# Patient Record
Sex: Male | Born: 1945 | Race: Black or African American | Marital: Married | Smoking: Former smoker
Health system: Southern US, Community
[De-identification: ages and names within clinical notes are randomized; demographics above are authoritative.]

## PROBLEM LIST (undated history)

## (undated) DIAGNOSIS — I10 Essential (primary) hypertension: Secondary | ICD-10-CM

## (undated) DIAGNOSIS — K219 Gastro-esophageal reflux disease without esophagitis: Secondary | ICD-10-CM

## (undated) DIAGNOSIS — J302 Other seasonal allergic rhinitis: Secondary | ICD-10-CM

## (undated) HISTORY — PX: BACK SURGERY: SHX140

## (undated) HISTORY — PX: PROSTATE SURGERY: SHX751

---

## 2010-05-03 ENCOUNTER — Ambulatory Visit: Payer: Self-pay | Admitting: Internal Medicine

## 2013-01-01 ENCOUNTER — Ambulatory Visit: Payer: Self-pay | Admitting: Physician Assistant

## 2013-01-01 LAB — URINALYSIS, COMPLETE
Bacteria: NEGATIVE
Bilirubin,UR: NEGATIVE
Glucose,UR: NEGATIVE mg/dL (ref 0–75)
Ketone: NEGATIVE
Nitrite: NEGATIVE
Ph: 7.5 (ref 4.5–8.0)
Squamous Epithelial: NONE SEEN

## 2013-01-02 ENCOUNTER — Emergency Department: Payer: Self-pay | Admitting: Emergency Medicine

## 2013-01-02 LAB — URINALYSIS, COMPLETE
Bilirubin,UR: NEGATIVE
Glucose,UR: NEGATIVE mg/dL (ref 0–75)
Leukocyte Esterase: NEGATIVE
Nitrite: NEGATIVE
Ph: 6 (ref 4.5–8.0)
Specific Gravity: 1.02 (ref 1.003–1.030)
WBC UR: 1 /HPF (ref 0–5)

## 2013-01-02 LAB — BASIC METABOLIC PANEL
BUN: 16 mg/dL (ref 7–18)
Chloride: 104 mmol/L (ref 98–107)
Co2: 32 mmol/L (ref 21–32)
Creatinine: 0.93 mg/dL (ref 0.60–1.30)
Potassium: 4 mmol/L (ref 3.5–5.1)
Sodium: 138 mmol/L (ref 136–145)

## 2013-01-02 LAB — CBC
HCT: 40 % (ref 40.0–52.0)
HGB: 12.7 g/dL — ABNORMAL LOW (ref 13.0–18.0)
MCH: 30.3 pg (ref 26.0–34.0)
MCHC: 31.7 g/dL — ABNORMAL LOW (ref 32.0–36.0)
MCV: 96 fL (ref 80–100)
RDW: 12.5 % (ref 11.5–14.5)
WBC: 3.7 10*3/uL — ABNORMAL LOW (ref 3.8–10.6)

## 2013-02-27 ENCOUNTER — Emergency Department: Payer: Self-pay | Admitting: Emergency Medicine

## 2013-06-12 ENCOUNTER — Ambulatory Visit: Payer: Self-pay | Admitting: Urology

## 2013-06-12 DIAGNOSIS — Z01810 Encounter for preprocedural cardiovascular examination: Secondary | ICD-10-CM

## 2013-06-12 LAB — HEMOGLOBIN: HGB: 12 g/dL — ABNORMAL LOW (ref 13.0–18.0)

## 2013-06-27 ENCOUNTER — Ambulatory Visit: Payer: Self-pay | Admitting: Urology

## 2013-06-28 LAB — CBC WITH DIFFERENTIAL/PLATELET
Basophil #: 0 10*3/uL (ref 0.0–0.1)
Basophil %: 0.1 %
Eosinophil #: 0 10*3/uL (ref 0.0–0.7)
Lymphocyte #: 0.6 10*3/uL — ABNORMAL LOW (ref 1.0–3.6)
Lymphocyte %: 5.9 %
MCH: 32.9 pg (ref 26.0–34.0)
MCV: 96 fL (ref 80–100)
Neutrophil #: 8.6 10*3/uL — ABNORMAL HIGH (ref 1.4–6.5)
RBC: 3.66 10*6/uL — ABNORMAL LOW (ref 4.40–5.90)
RDW: 12.7 % (ref 11.5–14.5)
WBC: 9.6 10*3/uL (ref 3.8–10.6)

## 2013-06-28 LAB — MAGNESIUM: Magnesium: 1.5 mg/dL — ABNORMAL LOW

## 2013-06-28 LAB — BASIC METABOLIC PANEL
Anion Gap: 2 — ABNORMAL LOW (ref 7–16)
Calcium, Total: 8.3 mg/dL — ABNORMAL LOW (ref 8.5–10.1)
Chloride: 108 mmol/L — ABNORMAL HIGH (ref 98–107)
Co2: 29 mmol/L (ref 21–32)
EGFR (African American): >60
EGFR (Non-African Amer.): >60
Glucose: 131 mg/dL — ABNORMAL HIGH (ref 65–99)

## 2013-06-28 LAB — PATHOLOGY REPORT

## 2014-02-13 IMAGING — CT CT STONE STUDY
1 of 2 series · 15 of 32 positions shown, 19 images · non-contrast
Comparison: none

REASON FOR EXAM: blood in urine, urinary retention
COMMENTS:

PROCEDURE:     CT  - CT ABDOMEN /PELVIS WO (STONE)  - January 02, 2013  [DATE]
RESULT:     CT abdomen pelvis dated 01/02/2013
TECHNIQUE: Helical noncontrasted 3 mm sections were obtained from the lung
bases through the pubic symphysis.

[Series 2: 3mm soft tissue · axial · 0.78mm/px · z∈[-494,-84]mm · 15 of 149 slices shown, 19 images]
[im 6/149  soft-tissue]
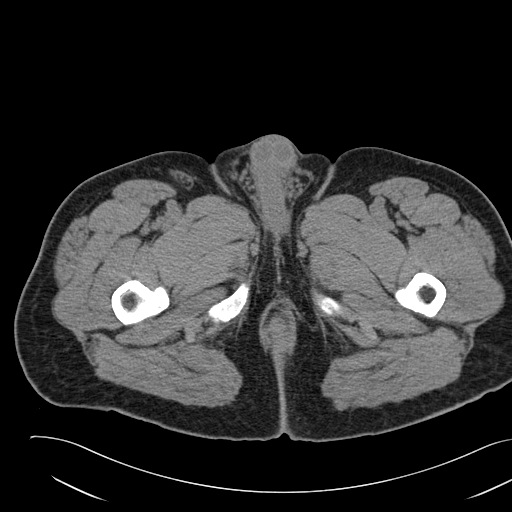
[im 6/149  bone]
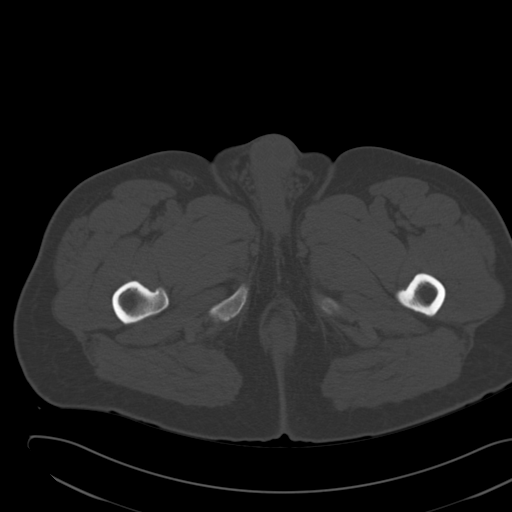
[im 18/149  soft-tissue]
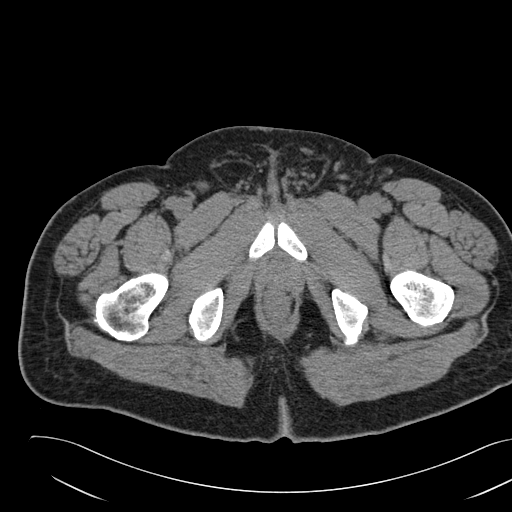
[im 30/149  soft-tissue]
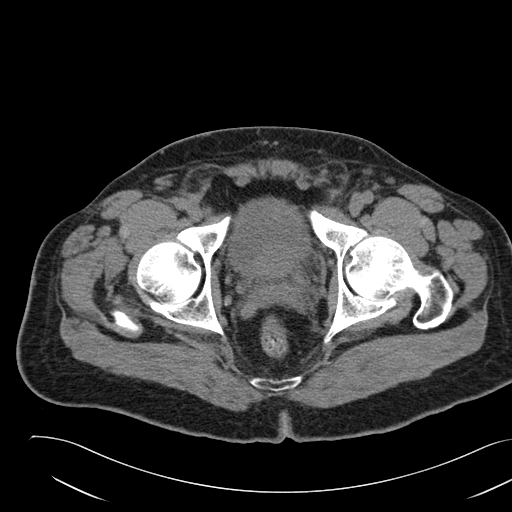
[im 42/149  soft-tissue]
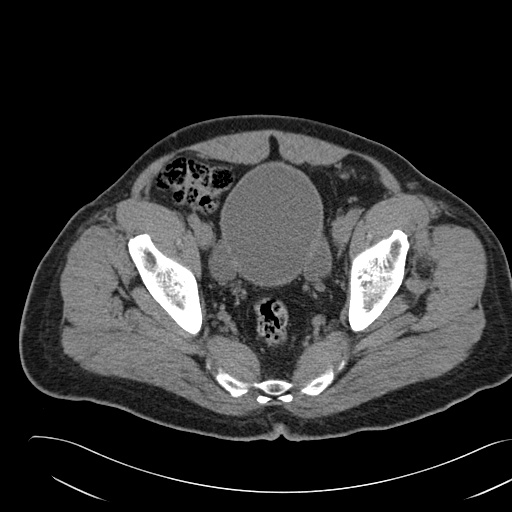
[im 54/149  soft-tissue]
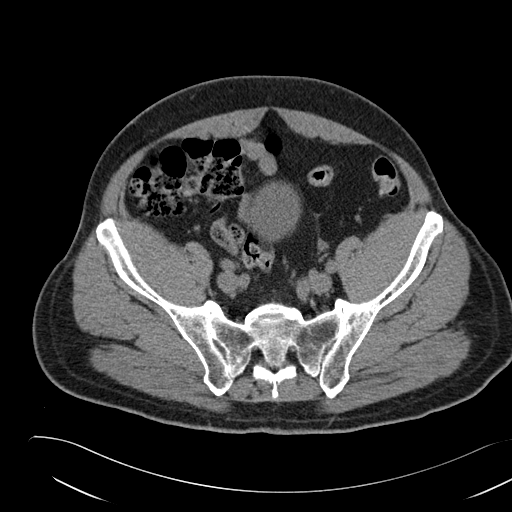
[im 66/149  soft-tissue]
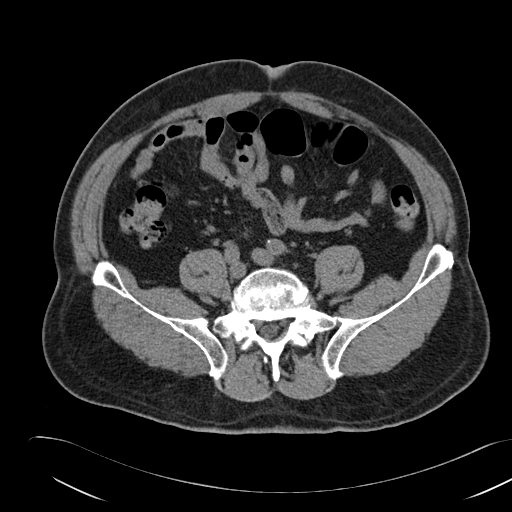
[im 77/149  soft-tissue]
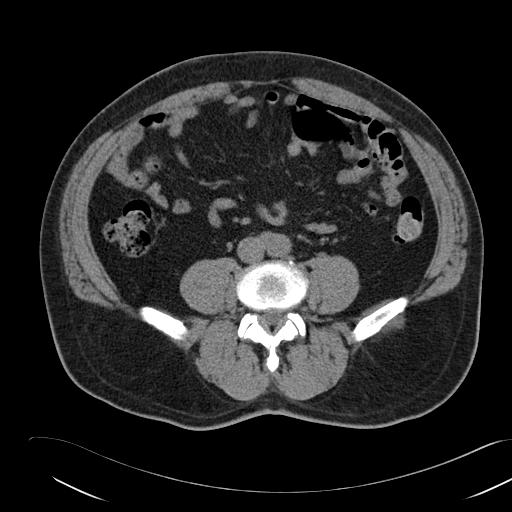
[im 83/149  soft-tissue]
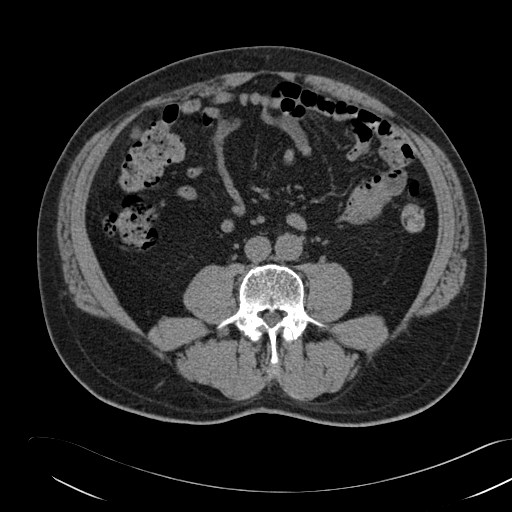
[im 95/149  soft-tissue]
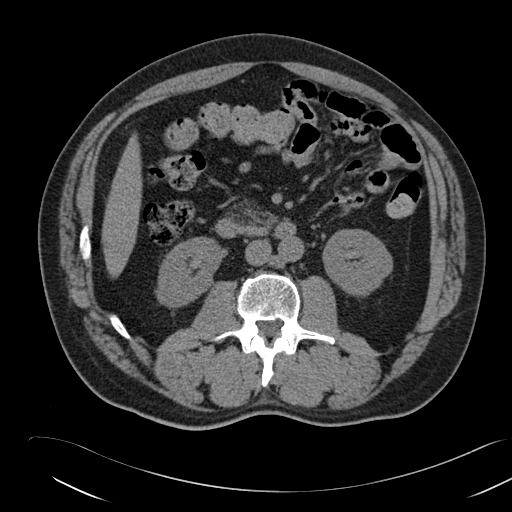
[im 95/149  bone]
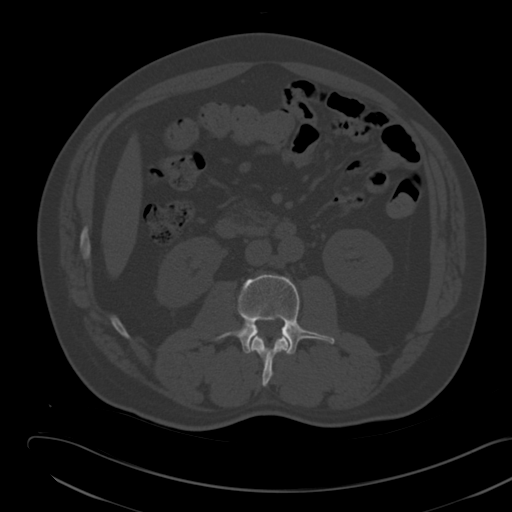
[im 107/149  soft-tissue]
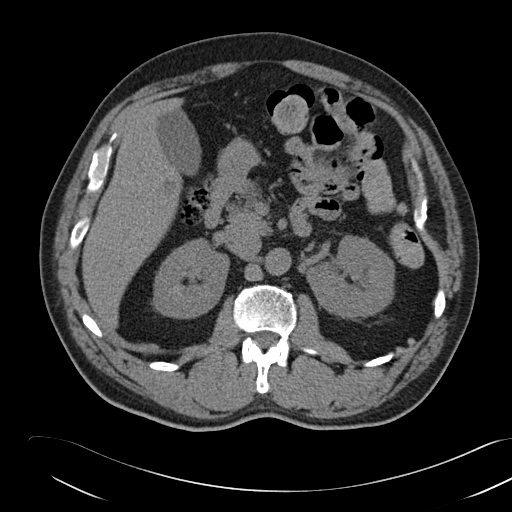
[im 119/149  soft-tissue]
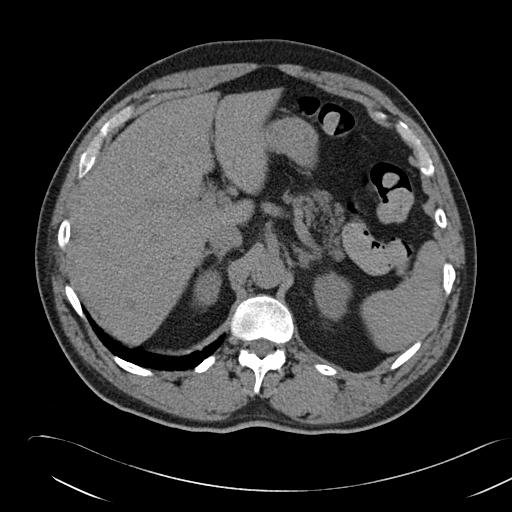
[im 125/149  lung]
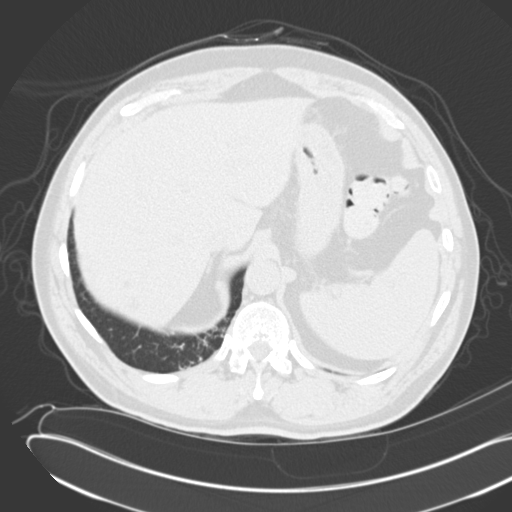
[im 131/149  soft-tissue]
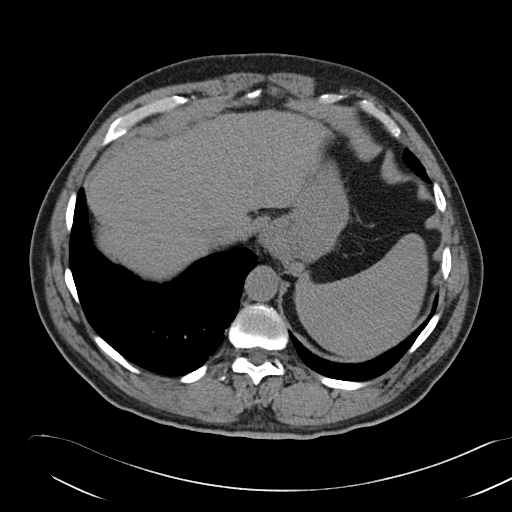
[im 131/149  lung]
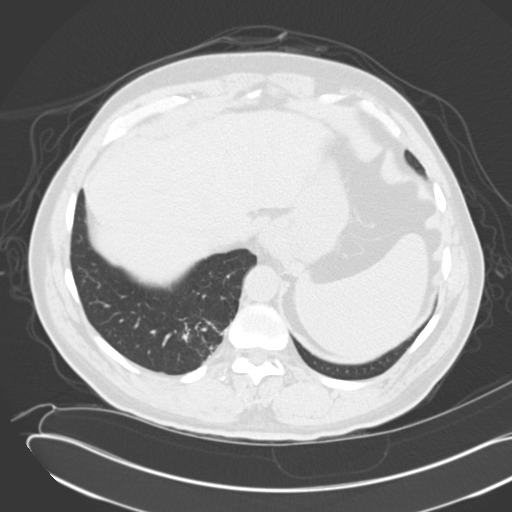
[im 137/149  lung]
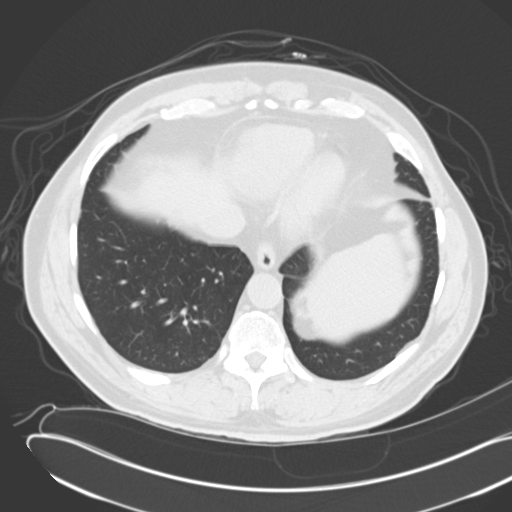
[im 143/149  soft-tissue]
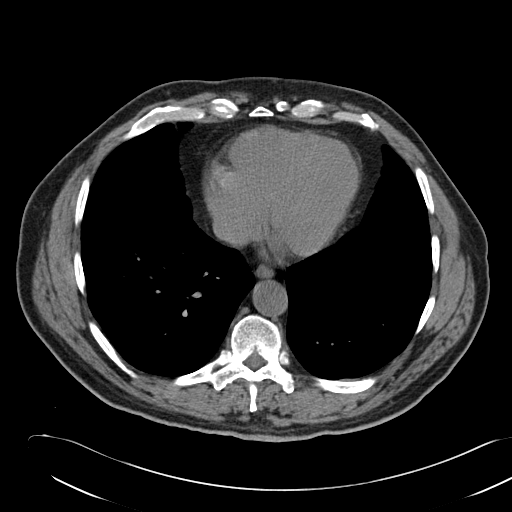
[im 143/149  lung]
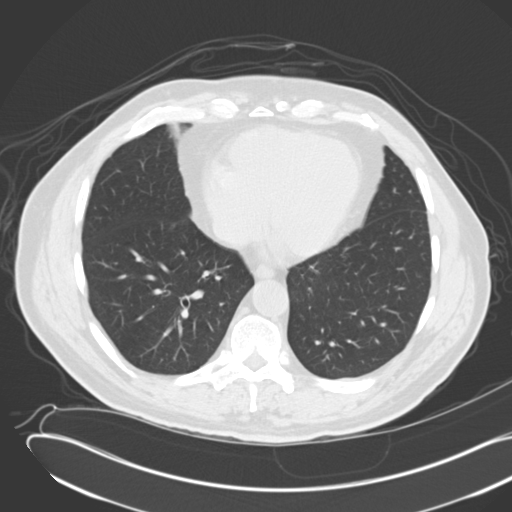

[15 of 32 positions shown; findings below may reference images not displayed]

FINDINGS: Evaluation lung bases demonstrates an area of ill-defined density
with a linear and fine nodular component in the medial basal segment of the
right lower lobe. Different considerations are atelectasis versus infiltrate
versus possibly area of fibrosis.

Noncontrast evaluation of the liver demonstrates multiple low attenuating
foci scattered throughout the liver. These areas have a nonspecific
appearance and may represent multiple cysts though more ominous etiologies
cannot be excluded. Clinical correlation recommended. If clinically
warranted further evaluation with nonemergent triphasic CT and/or MRI is
recommended.

The spleen, adrenals, pancreas, right kidney are unremarkable. Evaluation
the left kidney demonstrates a 2.23 cm low attenuating focus within the
anterior midpole different considerations are a cyst though a solid mass
cannot be excluded. The left kidney is otherwise unremarkable. There is no
evidence of hydronephrosis, hydroureter, nephrolithiasis nor
ureterolithiasis. There is no CT evidence of bowel obstruction, enteritis,
colitis, diverticulitis common nor appendicitis. Regress evaluation of the
pelvis demonstrates diverticula involving the urinary bladder. The prostate
is prominent with indentation along the base of the urinary bladder. There
is otherwise no evidence of pelvic masses, free fluid or loculated fluid
collections. A moderate amount of fecal retention is identified within the
colon.
IMPRESSION: Urinary bladder diverticula.
2. Indeterminate focus in the left kidney though statistically like
representing a cyst.
3. Multiple indeterminant foci within the liver again multiple liver cysts
are of diagnostic consideration though as stated above more ominous
etiologies cannot be excluded.
4. No evidence of obstructive uropathy, nor renal calculus disease.
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
## 2015-04-05 NOTE — Op Note (Signed)
PATIENT NAME:  Luis Jennings, Luis Jennings MR#:  [id number] DATE OF BIRTH:  [birth year]  DATE OF PROCEDURE:  06/27/2013  PRINCIPAL DIAGNOSIS: Benign prostatic hypertrophy, bladder outlet obstruction.   POSTOPERATIVE DIAGNOSIS: Benign prostatic hypertrophy, bladder outlet obstruction.   PROCEDURE: Transurethral resection of the prostate.   SURGEON: Madolyn FriezeBrian S. Achilles Dunkope, MD  ANESTHESIA: Laryngeal mask airway anesthesia.   INDICATIONS: The patient is a 69 year old African-American gentleman with a long history of progressive BPH symptoms. He has been on maximal medical management. Recent cystoscopy demonstrated prominent bilobar hypertrophy with bladder diverticula formation. He presents for transurethral resection of the prostate.   DESCRIPTION OF PROCEDURE: After informed consent was obtained, the patient was taken to the operating room and placed in the dorsal lithotomy position under laryngeal mask airway anesthesia. The patient was then prepped and draped in the usual standard fashion. The saline resectoscope sheath was placed utilizing the visual obturator, with no significant urethral abnormalities noted. Upon entering the prostatic fossa, the verumontanum was easily identified. Prominent bilobar prostatic hypertrophy was noted at the level of the bladder neck. Split lobar tissue was noted extending into the bladder. This was not a definitive median lobe. Prominent hypervascularity was present. Bilateral ureteral orifices were well-visualized within the bladder, with no lesions noted. No gross mucosal lesions were appreciated. At least 5 bladder diverticula were noted on the bilateral lateral walls. They were estimated to be between 75 and 150 mL in size. The loop was then placed through the scope. Resection was begun at the level of the bladder neck on the bilobar tissue. The resection was taken down to the level of the trigone. Additional lateral lobe tissue was taken down to the level of the verumontanum. Prominent  bilobar tissue was present. Minimal anterior tissue was noted. The bulk of the bleeders were cauterized as they were encountered, overall minimizing blood loss. Once the bulk of the resection was completed, the chips were irrigated from the bladder. These were collected to be sent for pathology analysis. The button electrode was then utilized to resect a moderate amount of additional tissue present after bladder decompression. It was also used to further cauterize through the prostate, with overall minimal bleeding encountered. The few remaining chips were irrigated from the bladder. These were added to the previously collected chips. The resectoscope was removed. A 22 French 3-way Foley catheter was then placed to gravity drainage without difficulty. It was also placed to continuous bladder irrigation. The patient tolerated the procedure well. There were no problems or complications.   ____________________________ Madolyn FriezeBrian S. Achilles Dunkope, MD bsc:OSi D: 06/27/2013 13:54:08 ET T: 06/27/2013 14:49:57 ET JOB#: [id number]  cc: Madolyn FriezeBrian S. Achilles Dunkope, MD, <Dictator> Madolyn FriezeBRIAN S Sindhu Nguyen MD ELECTRONICALLY SIGNED 06/27/2013 19:28

## 2015-04-05 NOTE — Discharge Summary (Signed)
PATIENT NAME:  Luis Jennings, Luis Jennings MR#:  [id number] DATE OF BIRTH:  1946/01/26  DATE OF ADMISSION:  06/27/2013 DATE OF DISCHARGE:  06/28/2013  PRINCIPAL DIAGNOSIS: Benign prostatic hypertrophy with bladder outlet obstruction.   POSTOPERATIVE DIAGNOSIS: Benign prostatic hypertrophy with bladder outlet obstruction.   PROCEDURE: Transurethral resection of the prostate.    INDICATION: The patient is a 69 year old African American gentleman with significant prostatic hypertrophy and bladder outlet obstruction. He has been managed on medical management with minimal success. He has been performing intermittent self-catheterization due to difficulty with emptying his bladder. Due to the size of the prostate and lack of response to medication, he has elected to proceed with transurethral resection of the prostate. He presents for this purpose.   After informed consent was obtained, the patient was taken to the operating room, at which time he underwent transurethral resection of the prostate. There were no intraoperative problems or complications. No significant bleeding was encountered. The patient's postoperative course was without significant problems or complications. He remained afebrile, vital signs stable throughout his hospitalization. He was maintained on continuous bladder irrigation postprocedure. There were no significant clots or other issues. He had no significant pain or discomfort. He was tolerating a general diet. The morning of postoperative day 1, his urine was noted to be minimally blood tinged on continuous bladder irrigation. The Foley catheter and bladder irrigation was discontinued. He was noted to void without difficulty, with acceptable postvoid residual. He was discharged to home on the morning of postoperative day 1 without significant problems or complications.   He was discharged on his usual admission medications in addition to Cipro 500 mg for a 7-day course and Vicodin 1 to 2 every 4  to 6 hours as needed for pain. He is to avoid any aspirin or aspirin-containing products for at least 2 weeks. He is to avoid any significant heavy lifting or straining. He is to return in 7 to 14 days for re-evaluation. He is to notify us if there are any significant problems or complications in the interim.   ____________________________ Madolyn FriezeBrian S. Achilles Dunkope, MD bsc:gb D: 06/28/2013 21:20:04 ET T: 06/28/2013 22:48:39 ET JOB#: [id number]  cc:    Madolyn FriezeBRIAN S Arneshia Ade MD ELECTRONICALLY SIGNED 06/29/2013 10:17

## 2015-04-25 ENCOUNTER — Encounter: Payer: Self-pay | Admitting: Emergency Medicine

## 2015-04-25 ENCOUNTER — Ambulatory Visit
Admission: EM | Admit: 2015-04-25 | Discharge: 2015-04-25 | Disposition: A | Discharge status: Home / Self Care | Payer: Medicare PPO | Attending: Family Medicine | Admitting: Family Medicine

## 2015-04-25 DIAGNOSIS — G44209 Tension-type headache, unspecified, not intractable: Secondary | ICD-10-CM | POA: Diagnosis not present

## 2015-04-25 HISTORY — DX: Other seasonal allergic rhinitis: J30.2

## 2015-04-25 HISTORY — DX: Gastro-esophageal reflux disease without esophagitis: K21.9

## 2015-04-25 MED ORDER — IBUPROFEN 800 MG PO TABS: 800.0000 mg | ORAL_TABLET | Freq: Once | ORAL | Status: DC

## 2015-04-25 MED ORDER — IBUPROFEN 400 MG PO TABS: 400.0000 mg | ORAL_TABLET | Freq: Once | ORAL | Status: DC
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
## 2015-04-25 NOTE — ED Notes (Signed)
Patient c/o ear pressure, sore throat, tightness in his throat, and nausea. Patient denies fevers.

## 2015-04-25 NOTE — Discharge Instructions (Signed)
You do not have any signs of any bad allergic reaction or any other emergent medical condition today. If you develop worsening headache, dizziness, weakness, vomiting, or any other concerning symptoms, please come back here or go to the emergency department.   Tension Headache A tension headache is a feeling of pain, pressure, or aching often felt over the front and sides of the head. The pain can be dull or can feel tight (constricting). It is the most common type of headache. Tension headaches are not normally associated with nausea or vomiting and do not get worse with physical activity. Tension headaches can last 30 minutes to several days.  CAUSES  The exact cause is not known, but it may be caused by chemicals and hormones in the brain that lead to pain. Tension headaches often begin after stress, anxiety, or depression. Other triggers may include:  Alcohol.  Caffeine (too much or withdrawal).  Respiratory infections (colds, flu, sinus infections).  Dental problems or teeth clenching.  Fatigue.  Holding your head and neck in one position too long while using a computer. SYMPTOMS   Pressure around the head.   Dull, aching head pain.   Pain felt over the front and sides of the head.   Tenderness in the muscles of the head, neck, and shoulders. DIAGNOSIS  A tension headache is often diagnosed based on:   Symptoms.   Physical examination.   A CT scan or MRI of your head. These tests may be ordered if symptoms are severe or unusual. TREATMENT  Medicines may be given to help relieve symptoms.  HOME CARE INSTRUCTIONS   Only take over-the-counter or prescription medicines for pain or discomfort as directed by your caregiver.   Lie down in a dark, quiet room when you have a headache.   Keep a journal to find out what may be triggering your headaches. For example, write down:  What you eat and drink.  How much sleep you get.  Any change to your diet or  medicines.  Try massage or other relaxation techniques.   Ice packs or heat applied to the head and neck can be used. Use these 3 to 4 times per day for 15 to 20 minutes each time, or as needed.   Limit stress.   Sit up straight, and do not tense your muscles.   Quit smoking if you smoke.  Limit alcohol use.  Decrease the amount of caffeine you drink, or stop drinking caffeine.  Eat and exercise regularly.  Get 7 to 9 hours of sleep, or as recommended by your caregiver.  Avoid excessive use of pain medicine as recurrent headaches can occur.  SEEK MEDICAL CARE IF:   You have problems with the medicines you were prescribed.  Your medicines do not work.  You have a change from the usual headache.  You have nausea or vomiting. SEEK IMMEDIATE MEDICAL CARE IF:   Your headache becomes severe.  You have a fever.  You have a stiff neck.  You have loss of vision.  You have muscular weakness or loss of muscle control.  You lose your balance or have trouble walking.  You feel faint or pass out.  You have severe symptoms that are different from your first symptoms. MAKE SURE YOU:   Understand these instructions.  Will watch your condition.  Will get help right away if you are not doing well or get worse. Document Released: 11/30/2005 Document Revised: 02/22/2012 Document Reviewed: 11/20/2011 ExitCare Patient Information 2015 [employer]. This information is not intended to replace advice given to you by your health care provider. Make sure you discuss any questions you have with your health care provider.

## 2016-10-16 ENCOUNTER — Encounter: Payer: Self-pay | Admitting: Emergency Medicine

## 2016-10-16 ENCOUNTER — Ambulatory Visit
Admission: EM | Admit: 2016-10-16 | Discharge: 2016-10-16 | Disposition: A | Discharge status: Home / Self Care | Payer: Medicare Other | Attending: Family Medicine | Admitting: Family Medicine

## 2016-10-16 DIAGNOSIS — R9431 Abnormal electrocardiogram [ECG] [EKG]: Secondary | ICD-10-CM | POA: Diagnosis not present

## 2016-10-16 DIAGNOSIS — K219 Gastro-esophageal reflux disease without esophagitis: Secondary | ICD-10-CM | POA: Diagnosis not present

## 2016-10-16 DIAGNOSIS — Z87891 Personal history of nicotine dependence: Secondary | ICD-10-CM | POA: Insufficient documentation

## 2016-10-16 DIAGNOSIS — R42 Dizziness and giddiness: Secondary | ICD-10-CM | POA: Insufficient documentation

## 2016-10-16 LAB — BASIC METABOLIC PANEL
ANION GAP: 6 (ref 5–15)
BUN: 13 mg/dL (ref 6–20)
CO2: 27 mmol/L (ref 22–32)
Calcium: 9.1 mg/dL (ref 8.9–10.3)
Chloride: 105 mmol/L (ref 101–111)
Creatinine, Ser: 0.86 mg/dL (ref 0.61–1.24)
GFR calc Af Amer: >60 mL/min (ref >60)
GFR calc non Af Amer: >60 mL/min (ref >60)
Glucose, Bld: 131 mg/dL — ABNORMAL HIGH (ref 65–99)
Potassium: 3.8 mmol/L (ref 3.5–5.1)
Sodium: 138 mmol/L (ref 135–145)

## 2016-10-16 LAB — CBC WITH DIFFERENTIAL/PLATELET
Basophils Absolute: 0 10*3/uL (ref 0–0.1)
Basophils Relative: 1 %
Eosinophils Absolute: 0.1 10*3/uL (ref 0–0.7)
Eosinophils Relative: 2 %
HCT: 39 % — ABNORMAL LOW (ref 40.0–52.0)
Hemoglobin: 13.2 g/dL (ref 13.0–18.0)
Lymphocytes Relative: 29 %
Lymphs Abs: 1.3 10*3/uL (ref 1.0–3.6)
MCH: 32 pg (ref 26.0–34.0)
MCHC: 33.8 g/dL (ref 32.0–36.0)
MCV: 94.6 fL (ref 80.0–100.0)
Monocytes Absolute: 0.3 10*3/uL (ref 0.2–1.0)
Monocytes Relative: 6 %
NEUTROS ABS: 2.8 10*3/uL (ref 1.4–6.5)
Neutrophils Relative %: 62 %
Platelets: 192 10*3/uL (ref 150–440)
RBC: 4.13 MIL/uL — ABNORMAL LOW (ref 4.40–5.90)
RDW: 13 % (ref 11.5–14.5)
WBC: 4.5 10*3/uL (ref 3.8–10.6)

## 2016-10-16 NOTE — ED Triage Notes (Signed)
Pt reports dizziness that started last night resolved after drinking vinegar and water and woke this morning feeling better but symptoms returned within last hour and reports frequent headaches and high blood pressure. Pt reports also had tingling over body last night

## 2016-10-20 ENCOUNTER — Telehealth: Payer: Self-pay

## 2016-10-20 NOTE — Telephone Encounter (Signed)
Courtesy call back completed today for patients recent visit at [facility]. Patient did not answer, left message on voicemail to call back with any questions or concerns.   

## 2016-12-06 NOTE — ED Provider Notes (Signed)
[facility]    CSN: [id number] Arrival date & time: 10/16/16  1120     History   Chief Complaint Chief Complaint  Patient presents with  . Dizziness    HPI Luis Jennings is a 70 y.o. male.   The history is provided by the patient.  Dizziness  Quality:  Lightheadedness Severity:  Mild Onset quality:  Sudden Duration:  1 day Timing:  Intermittent Progression:  Waxing and waning Chronicity:  New Context: not with bowel movement, not with ear pain, not with eye movement, not with head movement, not with loss of consciousness, not with physical activity and not when urinating   Relieved by: vinegar (per patient) Associated symptoms: no blood in stool, no chest pain, no diarrhea, no headaches, no hearing loss, no nausea, no palpitations, no shortness of breath, no syncope, no tinnitus, no vision changes, no vomiting and no weakness   Risk factors: no hx of vertigo and no Meniere's disease     Past Medical History:  Diagnosis Date  . GERD (gastroesophageal reflux disease)   . Seasonal allergies     There are no active problems to display for this patient.   Past Surgical History:  Procedure Laterality Date  . BACK SURGERY    . PROSTATE SURGERY         Home Medications    Prior to Admission medications   Medication Sig Start Date End Date Taking? Authorizing Provider  tamsulosin (FLOMAX) 0.4 MG CAPS capsule Take 0.4 mg by mouth.   Yes Historical Provider, MD  cetirizine (ZYRTEC) 10 MG tablet Take 10 mg by mouth daily.    Historical Provider, MD  fluticasone (FLONASE) 50 MCG/ACT nasal spray Place 1 spray into both nostrils daily.    Historical Provider, MD  omeprazole (PRILOSEC) 20 MG capsule Take 20 mg by mouth daily.    Historical Provider, MD    Family History Family History  Problem Relation Age of Onset  . Hypertension Mother   . Diabetes Mother   . Cancer Mother     Social History Social History  Substance Use Topics  . Smoking status:  Former Games developer  . Smokeless tobacco: Never Used  . Alcohol use No     Allergies   Patient has no known allergies.   Review of Systems Review of Systems  HENT: Negative for hearing loss and tinnitus.   Respiratory: Negative for shortness of breath.   Cardiovascular: Negative for chest pain, palpitations and syncope.  Gastrointestinal: Negative for blood in stool, diarrhea, nausea and vomiting.  Neurological: Positive for dizziness. Negative for weakness and headaches.     Physical Exam Triage Vital Signs ED Triage Vitals  Enc Vitals Group     BP 10/16/16 1142 (!) 156/94     Pulse Rate 10/16/16 1142 68     Resp 10/16/16 1142 16     Temp 10/16/16 1142 97.9 F (36.6 C)     Temp Source 10/16/16 1142 Tympanic     SpO2 10/16/16 1142 96 %     Weight 10/16/16 1143 206 lb (93.4 kg)     Height 10/16/16 1143 5\' 9"  (1.753 m)     Head Circumference --      Peak Flow --      Pain Score 10/16/16 1308 2     Pain Loc --      Pain Edu? --      Excl. in GC? --    No data found.   Updated Vital Signs BP  136/89 (BP Location: Left Arm)   Pulse 64   Temp 97.9 F (36.6 C) (Tympanic)   Resp 16   Ht 5\' 9"  (1.753 m)   Wt 206 lb (93.4 kg)   SpO2 96%   BMI 30.42 kg/m   Visual Acuity Right Eye Distance:   Left Eye Distance:   Bilateral Distance:    Right Eye Near:   Left Eye Near:    Bilateral Near:     Physical Exam  Constitutional: He is oriented to person, place, and time. He appears well-developed and well-nourished. No distress.  HENT:  Head: Normocephalic and atraumatic.  Right Ear: Tympanic membrane, external ear and ear canal normal.  Left Ear: Tympanic membrane, external ear and ear canal normal.  Nose: Nose normal.  Mouth/Throat: Uvula is midline, oropharynx is clear and moist and mucous membranes are normal. No oropharyngeal exudate or tonsillar abscesses.  Eyes: Conjunctivae and EOM are normal. Pupils are equal, round, and reactive to light. Right eye exhibits no  discharge. Left eye exhibits no discharge. No scleral icterus.  Neck: Normal range of motion. Neck supple. No tracheal deviation present. No thyromegaly present.  Cardiovascular: Normal rate, regular rhythm and normal heart sounds.   Pulmonary/Chest: Effort normal and breath sounds normal. No stridor. No respiratory distress. He has no wheezes. He has no rales. He exhibits no tenderness.  Lymphadenopathy:    He has no cervical adenopathy.  Neurological: He is alert and oriented to person, place, and time. He displays normal reflexes. No cranial nerve deficit or sensory deficit. He exhibits normal muscle tone. Coordination normal.  Skin: Skin is warm and dry. No rash noted. He is not diaphoretic.  Nursing note and vitals reviewed.    UC Treatments / Results  Labs (all labs ordered are listed, but only abnormal results are displayed) Labs Reviewed  BASIC METABOLIC PANEL - Abnormal; Notable for the following:       Result Value   Glucose, Bld 131 (*)    All other components within normal limits  CBC WITH DIFFERENTIAL/PLATELET - Abnormal; Notable for the following:    RBC 4.13 (*)    HCT 39.0 (*)    All other components within normal limits    EKG  EKG Interpretation None       Radiology No results found.  Procedures ED EKG Date/Time: 12/06/2016 3:50 PM Performed by: Payton MccallumONTY, Kourtni Stineman Authorized by: Payton MccallumONTY, Brienne Liguori   ECG reviewed by ED Physician in the absence of a cardiologist: yes   Previous ECG:    Previous ECG:  Compared to current Interpretation:    Interpretation: normal   Rate:    ECG rate assessment: normal   Rhythm:    Rhythm: sinus rhythm   Ectopy:    Ectopy: none   QRS:    QRS axis:  Normal Conduction:    Conduction: normal   ST segments:    ST segments:  Normal T waves:    T waves: normal     (including critical care time)  Medications Ordered in UC Medications - No data to display   Initial Impression / Assessment and Plan / UC Course  I have  reviewed the triage vital signs and the nursing notes.  Pertinent labs & imaging results that were available during my care of the patient were reviewed by me and considered in my medical decision making (see chart for details).  Clinical Course       Final Clinical Impressions(s) / UC Diagnoses   Final diagnoses:  Dizziness  (unknown etiology; possible side effect of Flomax)  New Prescriptions Discharge Medication List as of 10/16/2016 12:57 PM     1. Labs/ekg results(negative) and diagnosis reviewed with patient; patient stable and currently physical exam is normal 2. Recommend supportive treatment with increased fluids 3. Follow-up with PCP  4. prn if symptoms worsen or don't improve   Payton Mccallumrlando Durk Carmen, MD 12/06/16 1555

## 2017-12-31 ENCOUNTER — Ambulatory Visit
Admission: EM | Admit: 2017-12-31 | Discharge: 2017-12-31 | Disposition: A | Discharge status: Home / Self Care | Payer: Medicare Other | Attending: Family Medicine | Admitting: Family Medicine

## 2017-12-31 ENCOUNTER — Encounter: Payer: Self-pay | Admitting: *Deleted

## 2017-12-31 ENCOUNTER — Ambulatory Visit (INDEPENDENT_AMBULATORY_CARE_PROVIDER_SITE_OTHER): Payer: Medicare Other

## 2017-12-31 ENCOUNTER — Other Ambulatory Visit: Payer: Self-pay

## 2017-12-31 DIAGNOSIS — R110 Nausea: Secondary | ICD-10-CM | POA: Diagnosis not present

## 2017-12-31 DIAGNOSIS — J309 Allergic rhinitis, unspecified: Secondary | ICD-10-CM

## 2017-12-31 DIAGNOSIS — R0602 Shortness of breath: Secondary | ICD-10-CM | POA: Diagnosis not present

## 2017-12-31 HISTORY — DX: Essential (primary) hypertension: I10

## 2017-12-31 LAB — BASIC METABOLIC PANEL
ANION GAP: 6 (ref 5–15)
BUN: 16 mg/dL (ref 6–20)
CALCIUM: 8.5 mg/dL — AB (ref 8.9–10.3)
CO2: 25 mmol/L (ref 22–32)
Chloride: 104 mmol/L (ref 101–111)
Creatinine, Ser: 0.84 mg/dL (ref 0.61–1.24)
GFR calc Af Amer: >60 mL/min (ref >60)
GLUCOSE: 145 mg/dL — AB (ref 65–99)
Potassium: 4 mmol/L (ref 3.5–5.1)
Sodium: 135 mmol/L (ref 135–145)

## 2017-12-31 LAB — CBC WITH DIFFERENTIAL/PLATELET
Basophils Absolute: 0 10*3/uL (ref 0–0.1)
Basophils Relative: 1 %
EOS PCT: 4 %
Eosinophils Absolute: 0.1 10*3/uL (ref 0–0.7)
HEMATOCRIT: 38.1 % — AB (ref 40.0–52.0)
Hemoglobin: 12.9 g/dL — ABNORMAL LOW (ref 13.0–18.0)
LYMPHS PCT: 33 %
Lymphs Abs: 1.2 10*3/uL (ref 1.0–3.6)
MCH: 32.1 pg (ref 26.0–34.0)
MCHC: 33.8 g/dL (ref 32.0–36.0)
MCV: 95 fL (ref 80.0–100.0)
Monocytes Absolute: 0.3 10*3/uL (ref 0.2–1.0)
Monocytes Relative: 8 %
NEUTROS ABS: 2 10*3/uL (ref 1.4–6.5)
Neutrophils Relative %: 54 %
PLATELETS: 200 10*3/uL (ref 150–440)
RBC: 4.01 MIL/uL — AB (ref 4.40–5.90)
RDW: 12.6 % (ref 11.5–14.5)
WBC: 3.7 10*3/uL — AB (ref 3.8–10.6)

## 2017-12-31 NOTE — ED Triage Notes (Signed)
Patient has been having symptoms of SOB and nausea for 3 months. Patient is a poor historian and difficult to understand all of his problems. Patient does report being treated with albuterol by PCP in the past.

## 2017-12-31 NOTE — ED Provider Notes (Signed)
[facility]    CSN: [id number] Arrival date & time: 12/31/17  0947     History   Chief Complaint Chief Complaint  Patient presents with  . Shortness of Breath  . Nausea    HPI Luis Jennings is a 72 y.o. male.   72 yo male with presents with a c/o multiple chronic issues such as intermittent shortness of breath, allergies, intermittent hoarse voice, acid reflux, nausea. States he's had these symptoms for months and has seen his PCP for this. Denies any acute worsening, fevers, chills, chest pains.       Past Medical History:  Diagnosis Date  . GERD (gastroesophageal reflux disease)   . Hypertension   . Seasonal allergies     There are no active problems to display for this patient.   Past Surgical History:  Procedure Laterality Date  . BACK SURGERY    . PROSTATE SURGERY         Home Medications    Prior to Admission medications   Medication Sig Start Date End Date Taking? Authorizing Provider  albuterol (PROVENTIL HFA;VENTOLIN HFA) 108 (90 Base) MCG/ACT inhaler Inhale 2 puffs into the lungs every 6 (six) hours as needed for wheezing or shortness of breath.   Yes [provider]  aspirin EC 81 MG tablet Take 81 mg by mouth daily.   Yes [provider]  cetirizine (ZYRTEC) 10 MG tablet Take 10 mg by mouth daily.   Yes [provider]  fluticasone (FLONASE) 50 MCG/ACT nasal spray Place 1 spray into both nostrils daily.   Yes [provider]  lisinopril (PRINIVIL,ZESTRIL) 2.5 MG tablet Take 2.5 mg by mouth daily.   Yes [provider]  omeprazole (PRILOSEC) 20 MG capsule Take 20 mg by mouth daily.   Yes [provider]  tamsulosin (FLOMAX) 0.4 MG CAPS capsule Take 0.4 mg by mouth.   Yes [provider]    Family History Family History  Problem Relation Age of Onset  . Hypertension Mother   . Diabetes Mother   . Cancer Mother     Social History Social History   Tobacco Use  .  Smoking status: Former Games developer  . Smokeless tobacco: Never Used  Substance Use Topics  . Alcohol use: No  . Drug use: No     Allergies   Patient has no known allergies.   Review of Systems Review of Systems   Physical Exam Triage Vital Signs ED Triage Vitals  Enc Vitals Group     BP 12/31/17 1002 (!) 156/80     Pulse Rate 12/31/17 1002 64     Resp 12/31/17 1002 18     Temp 12/31/17 1002 98.5 F (36.9 C)     Temp Source 12/31/17 1002 Oral     SpO2 12/31/17 1002 99 %     Weight 12/31/17 1003 210 lb (95.3 kg)     Height 12/31/17 1003 5\' 10"  (1.778 m)     Head Circumference --      Peak Flow --      Pain Score 12/31/17 1003 0     Pain Loc --      Pain Edu? --      Excl. in GC? --    No data found.  Updated Vital Signs BP (!) 156/80 (BP Location: Left Arm)   Pulse 64   Temp 98.5 F (36.9 C) (Oral)   Resp 18   Ht 5\' 10"  (1.778 m)   Wt 210 lb (  95.3 kg)   SpO2 99%   BMI 30.13 kg/m   Visual Acuity Right Eye Distance:   Left Eye Distance:   Bilateral Distance:    Right Eye Near:   Left Eye Near:    Bilateral Near:     Physical Exam  Constitutional: He appears well-developed and well-nourished. No distress.  HENT:  Head: Normocephalic and atraumatic.  Right Ear: Tympanic membrane, external ear and ear canal normal.  Left Ear: Tympanic membrane, external ear and ear canal normal.  Nose: Nose normal.  Mouth/Throat: Uvula is midline, oropharynx is clear and moist and mucous membranes are normal. No oropharyngeal exudate or tonsillar abscesses.  Eyes: Conjunctivae and EOM are normal. Pupils are equal, round, and reactive to light. Right eye exhibits no discharge. Left eye exhibits no discharge. No scleral icterus.  Neck: Normal range of motion. Neck supple. No tracheal deviation present. No thyromegaly present.  Cardiovascular: Normal rate, regular rhythm and normal heart sounds.  Pulmonary/Chest: Effort normal and breath sounds normal. No stridor. No respiratory  distress. He has no wheezes. He has no rales. He exhibits no tenderness.  Abdominal: Soft. Bowel sounds are normal. He exhibits no distension. There is no tenderness. There is no guarding.  Lymphadenopathy:    He has no cervical adenopathy.  Neurological: He is alert.  Skin: Skin is warm and dry. No rash noted. He is not diaphoretic.  Nursing note and vitals reviewed.    UC Treatments / Results  Labs (all labs ordered are listed, but only abnormal results are displayed) Labs Reviewed  CBC WITH DIFFERENTIAL/PLATELET - Abnormal; Notable for the following components:      Result Value   WBC 3.7 (*)    RBC 4.01 (*)    Hemoglobin 12.9 (*)    HCT 38.1 (*)    All other components within normal limits  BASIC METABOLIC PANEL - Abnormal; Notable for the following components:   Glucose, Bld 145 (*)    Calcium 8.5 (*)    All other components within normal limits    EKG  EKG Interpretation None       Radiology Dg Chest 2 View  Result Date: 12/31/2017 CLINICAL DATA:  Scratchy throat. Chest tightness. Shortness of breath. EXAM: CHEST  2 VIEW COMPARISON:  02/27/2013. FINDINGS: Mediastinum hilar structures normal. Lungs are clear. No pleural effusion or pneumothorax. Stable cardiomegaly. Thoracic spine scoliosis. IMPRESSION: 1.  Stable cardiomegaly.  No CHF. 2.  No acute pulmonary disease. Electronically Signed   By: Maisie Fushomas  Register   On: 12/31/2017 11:06    Procedures Procedures (including critical care time)  Medications Ordered in UC Medications - No data to display   Initial Impression / Assessment and Plan / UC Course  I have reviewed the triage vital signs and the nursing notes.  Pertinent labs & imaging results that were available during my care of the patient were reviewed by me and considered in my medical decision making (see chart for details).       Final Clinical Impressions(s) / UC Diagnoses   Final diagnoses:  Shortness of breath  Allergic rhinitis,  unspecified seasonality, unspecified trigger    ED Discharge Orders    None     1. Labs/x-ray results and diagnosis reviewed with patient 2. Continue current medications  3. Follow up with PCP 4. Follow-up prn if symptoms worsen or don't improve  Controlled Substance Prescriptions [place] Controlled Substance Registry consulted? Not Applicable   Payton Mccallumonty, Jamin Panther, MD 12/31/17 1125

## 2017-12-31 NOTE — Discharge Instructions (Signed)
Continue current allergy medication and inhaler  Follow up with Primary Care Provider

## 2018-01-03 ENCOUNTER — Telehealth: Payer: Self-pay | Admitting: Emergency Medicine

## 2018-01-03 NOTE — Telephone Encounter (Signed)
Called to follow up after patient's recent visit. Patient states he is feeling better. 

## 2022-08-10 ENCOUNTER — Encounter (INDEPENDENT_AMBULATORY_CARE_PROVIDER_SITE_OTHER): Payer: Self-pay | Admitting: Vascular Surgery

## 2022-08-10 ENCOUNTER — Ambulatory Visit (INDEPENDENT_AMBULATORY_CARE_PROVIDER_SITE_OTHER): Payer: Medicare PPO | Admitting: Vascular Surgery

## 2022-08-10 DIAGNOSIS — I872 Venous insufficiency (chronic) (peripheral): Secondary | ICD-10-CM

## 2022-08-10 DIAGNOSIS — I10 Essential (primary) hypertension: Secondary | ICD-10-CM

## 2022-08-10 DIAGNOSIS — I8310 Varicose veins of unspecified lower extremity with inflammation: Secondary | ICD-10-CM | POA: Diagnosis not present

## 2022-08-10 DIAGNOSIS — K219 Gastro-esophageal reflux disease without esophagitis: Secondary | ICD-10-CM

## 2022-08-10 NOTE — Progress Notes (Signed)
MRN : [id number]  Karden Schneekloth is a 76 y.o. ([birth year]) male who presents with chief complaint of varicose veins hurt.  History of Present Illness:   Patient is seen for evaluation of leg pain and leg swelling right > left. The patient first noticed the the painful discoloration of the right ankle years ago. The swelling is associated with pain and discoloration. The pain and swelling worsens with prolonged dependency and improves with elevation. The pain is unrelated to activity.  The patient notes that in the morning the legs are significantly improved but they steadily worsened throughout the course of the day. The patient also notes a steady worsening of the discoloration in the ankle and shin area.   The patient denies claudication symptoms.  The patient denies symptoms consistent with rest pain.  The patient denies and extensive history of DJD and LS spine disease.  The patient has no had any past angiography, interventions or vascular surgery.  Elevation makes the leg symptoms better, dependency makes them much worse. There is no history of ulcerations. The patient denies any recent changes in medications.  The patient has not been wearing graduated compression routinely.  The patient denies a history of DVT or PE. There is no prior history of phlebitis. There is no history of primary lymphedema.  No history of malignancies. No history of trauma or groin or pelvic surgery. There is no history of radiation treatment to the groin or pelvis  The patient denies amaurosis fugax or recent TIA symptoms. There are no recent neurological changes noted. The patient denies recent episodes of angina or shortness of breath   Current Meds  Medication Sig   albuterol (PROVENTIL HFA;VENTOLIN HFA) 108 (90 Base) MCG/ACT inhaler Inhale 2 puffs into the lungs every 6 (six) hours as needed for wheezing or shortness of breath.   aspirin EC 81 MG tablet Take 81 mg by mouth daily.   cetirizine  (ZYRTEC) 10 MG tablet Take 10 mg by mouth daily.   fluticasone (FLONASE) 50 MCG/ACT nasal spray Place 1 spray into both nostrils daily.   omeprazole (PRILOSEC) 20 MG capsule Take 20 mg by mouth daily.   tamsulosin (FLOMAX) 0.4 MG CAPS capsule Take 0.4 mg by mouth.    Past Medical History:  Diagnosis Date   GERD (gastroesophageal reflux disease)    Hypertension    Seasonal allergies     Past Surgical History:  Procedure Laterality Date   BACK SURGERY     PROSTATE SURGERY      Social History Social History   Tobacco Use   Smoking status: Former   Smokeless tobacco: Never  Substance Use Topics   Alcohol use: No   Drug use: No    Family History Family History  Problem Relation Age of Onset   Hypertension Mother    Diabetes Mother    Cancer Mother     No Known Allergies   REVIEW OF SYSTEMS (Negative unless checked)  Constitutional: [] Weight loss  [] Fever  [] Chills Cardiac: [] Chest pain   [] Chest pressure   [] Palpitations   [] Shortness of breath when laying flat   [] Shortness of breath with exertion. Vascular:  [] Pain in legs with walking   [x] Pain in legs with standing  [] History of DVT   [] Phlebitis   [] Swelling in legs   [x] Varicose veins   [] Non-healing ulcers Pulmonary:   [] Uses home oxygen   [] Productive cough   [] Hemoptysis   [] Wheeze  [] COPD   [] Asthma Neurologic:  [] Dizziness   []   Seizures   [] History of stroke   [] History of TIA  [] Aphasia   [] Vissual changes   [] Weakness or numbness in arm   [] Weakness or numbness in leg Musculoskeletal:   [] Joint swelling   [] Joint pain   [] Low back pain Hematologic:  [] Easy bruising  [] Easy bleeding   [] Hypercoagulable state   [] Anemic Gastrointestinal:  [] Diarrhea   [] Vomiting  [x] Gastroesophageal reflux/heartburn   [] Difficulty swallowing. Genitourinary:  [] Chronic kidney disease   [] Difficult urination  [] Frequent urination   [] Blood in urine Skin:  [] Rashes   [] Ulcers  Psychological:  [] History of anxiety   []  History of  major depression.  Physical Examination  Vitals:   08/10/22 1147  BP: 131/80  Pulse: 74  Resp: 16  Weight: 212 lb 6.4 oz (96.3 kg)   Body mass index is 30.48 kg/m. Gen: WD/WN, NAD Head: [place]/AT, No temporalis wasting.  Ear/Nose/Throat: Hearing grossly intact, nares w/o erythema or drainage, pinna without lesions Eyes: PER, EOMI, sclera nonicteric.  Neck: Supple, no gross masses.  No JVD.  Pulmonary:  Good air movement, no audible wheezing, no use of accessory muscles.  Cardiac: RRR, precordium not hyperdynamic. Vascular:    scattered varicosities present bilaterally.  Severe venous stasis changes to the legs right > left.  2+ soft pitting edema  Vessel Right Left  Radial Palpable Palpable  Gastrointestinal: soft, non-distended. No guarding/no peritoneal signs.  Musculoskeletal: M/S 5/5 throughout.  No deformity.  Neurologic: CN 2-12 intact. Pain and light touch intact in extremities.  Symmetrical.  Speech is fluent. Motor exam as listed above. Psychiatric: Judgment intact, Mood & affect appropriate for pt's clinical situation. Dermatologic: Venous rashes no ulcers noted.  No changes consistent with cellulitis. Lymph : No lichenification or skin changes of chronic lymphedema.  CBC Lab Results  Component Value Date   WBC 3.7 (L) 12/31/2017   HGB 12.9 (L) 12/31/2017   HCT 38.1 (L) 12/31/2017   MCV 95.0 12/31/2017   PLT 200 12/31/2017    BMET    Component Value Date/Time   NA 135 12/31/2017 1028   NA 139 06/28/2013 0524   K 4.0 12/31/2017 1028   K 4.6 06/28/2013 0524   CL 104 12/31/2017 1028   CL 108 (H) 06/28/2013 0524   CO2 25 12/31/2017 1028   CO2 29 06/28/2013 0524   GLUCOSE 145 (H) 12/31/2017 1028   GLUCOSE 131 (H) 06/28/2013 0524   BUN 16 12/31/2017 1028   BUN 16 06/28/2013 0524   CREATININE 0.84 12/31/2017 1028   CREATININE 1.08 06/28/2013 0524   CALCIUM 8.5 (L) 12/31/2017 1028   CALCIUM 8.3 (L) 06/28/2013 0524   GFRNONAA >60 12/31/2017 1028   GFRNONAA >60  06/28/2013 0524   GFRAA >60 12/31/2017 1028   GFRAA >60 06/28/2013 0524   CrCl cannot be calculated (Patient's most recent lab result is older than the maximum 21 days allowed.).  COAG No results found for: "INR", "PROTIME"  Radiology No results found.   Assessment/Plan 1. Chronic venous insufficiency Recommend:  I have had a long discussion with the patient regarding swelling and why it  causes symptoms.  Patient will begin wearing graduated compression on a daily basis a prescription was given. The patient will  wear the stockings first thing in the morning and removing them in the evening. The patient is instructed specifically not to sleep in the stockings.   In addition, behavioral modification will be initiated.  This will include frequent elevation, use of over the counter pain medications and exercise  such as walking.  Consideration for a lymph pump will also be made based upon the effectiveness of conservative therapy.  This would help to improve the edema control and prevent sequela such as ulcers and infections   Patient should undergo duplex ultrasound of the venous system to ensure that DVT or reflux is not present.  The patient will follow-up with me after the ultrasound.    2. Varicose veins with inflammation Recommend:  I have had a long discussion with the patient regarding swelling and why it  causes symptoms.  Patient will begin wearing graduated compression on a daily basis a prescription was given. The patient will  wear the stockings first thing in the morning and removing them in the evening. The patient is instructed specifically not to sleep in the stockings.   In addition, behavioral modification will be initiated.  This will include frequent elevation, use of over the counter pain medications and exercise such as walking.  Consideration for a lymph pump will also be made based upon the effectiveness of conservative therapy.  This would help to improve the  edema control and prevent sequela such as ulcers and infections   Patient should undergo duplex ultrasound of the venous system to ensure that DVT or reflux is not present.  The patient will follow-up with me after the ultrasound.    3. Essential hypertension Continue antihypertensive medications as already ordered, these medications have been reviewed and there are no changes at this time.   4. Gastroesophageal reflux disease, unspecified whether esophagitis present Continue PPI as already ordered, this medication has been reviewed and there are no changes at this time.  Avoidence of caffeine and alcohol  Moderate elevation of the head of the bed      Levora Dredge, MD  08/10/2022 11:50 AM

## 2022-11-09 NOTE — Progress Notes (Deleted)
MRN : [id number]  Luis Jennings is a 76 y.o. (Dec 23, 1945) male who presents with chief complaint of legs swell.  History of Present Illness:   The patient returns to the office for followup evaluation regarding leg swelling.  The swelling has persisted and the pain associated with swelling continues. There have not been any interval development of a ulcerations or wounds.  Since the previous visit the patient has been wearing graduated compression stockings and has noted little if any improvement in the lymphedema. The patient has been using compression routinely morning until night.  The patient also states elevation during the day and exercise is being done too.  No outpatient medications have been marked as taking for the 11/12/22 encounter (Appointment) with Gilda Crease, Latina Craver, MD.    Past Medical History:  Diagnosis Date   GERD (gastroesophageal reflux disease)    Hypertension    Seasonal allergies     Past Surgical History:  Procedure Laterality Date   BACK SURGERY     PROSTATE SURGERY      Social History Social History   Tobacco Use   Smoking status: Former   Smokeless tobacco: Never  Substance Use Topics   Alcohol use: No   Drug use: No    Family History Family History  Problem Relation Age of Onset   Hypertension Mother    Diabetes Mother    Cancer Mother     No Known Allergies   REVIEW OF SYSTEMS (Negative unless checked)  Constitutional: [] Weight loss  [] Fever  [] Chills Cardiac: [] Chest pain   [] Chest pressure   [] Palpitations   [] Shortness of breath when laying flat   [] Shortness of breath with exertion. Vascular:  [] Pain in legs with walking   [x] Pain in legs with standing  [] History of DVT   [] Phlebitis   [x] Swelling in legs   [] Varicose veins   [] Non-healing ulcers Pulmonary:   [] Uses home oxygen   [] Productive cough   [] Hemoptysis   [] Wheeze  [] COPD   [] Asthma Neurologic:  [] Dizziness   [] Seizures   [] History of stroke   [] History of TIA   [] Aphasia   [] Vissual changes   [] Weakness or numbness in arm   [] Weakness or numbness in leg Musculoskeletal:   [] Joint swelling   [] Joint pain   [] Low back pain Hematologic:  [] Easy bruising  [] Easy bleeding   [] Hypercoagulable state   [] Anemic Gastrointestinal:  [] Diarrhea   [] Vomiting  [x] Gastroesophageal reflux/heartburn   [] Difficulty swallowing. Genitourinary:  [] Chronic kidney disease   [] Difficult urination  [] Frequent urination   [] Blood in urine Skin:  [] Rashes   [] Ulcers  Psychological:  [] History of anxiety   []  History of major depression.  Physical Examination  There were no vitals filed for this visit. There is no height or weight on file to calculate BMI. Gen: WD/WN, NAD Head: [place]/AT, No temporalis wasting.  Ear/Nose/Throat: Hearing grossly intact, nares w/o erythema or drainage, pinna without lesions Eyes: PER, EOMI, sclera nonicteric.  Neck: Supple, no gross masses.  No JVD.  Pulmonary:  Good air movement, no audible wheezing, no use of accessory muscles.  Cardiac: RRR, precordium not hyperdynamic. Vascular:  scattered varicosities present bilaterally.  Mild venous stasis changes to the legs bilaterally.  3-4+ soft pitting edema, CEAP [id number]  Vessel Right Left  Radial Palpable Palpable  Gastrointestinal: soft, non-distended. No guarding/no peritoneal signs.  Musculoskeletal: M/S 5/5 throughout.  No deformity.  Neurologic: CN 2-12 intact. Pain and light touch intact in extremities.  Symmetrical.  Speech  is fluent. Motor exam as listed above. Psychiatric: Judgment intact, Mood & affect appropriate for pt's clinical situation. Dermatologic: Venous rashes no ulcers noted.  No changes consistent with cellulitis. Lymph : No lichenification or skin changes of chronic lymphedema.  CBC Lab Results  Component Value Date   WBC 3.7 (L) 12/31/2017   HGB 12.9 (L) 12/31/2017   HCT 38.1 (L) 12/31/2017   MCV 95.0 12/31/2017   PLT 200 12/31/2017    BMET    Component Value  Date/Time   NA 135 12/31/2017 1028   NA 139 06/28/2013 0524   K 4.0 12/31/2017 1028   K 4.6 06/28/2013 0524   CL 104 12/31/2017 1028   CL 108 (H) 06/28/2013 0524   CO2 25 12/31/2017 1028   CO2 29 06/28/2013 0524   GLUCOSE 145 (H) 12/31/2017 1028   GLUCOSE 131 (H) 06/28/2013 0524   BUN 16 12/31/2017 1028   BUN 16 06/28/2013 0524   CREATININE 0.84 12/31/2017 1028   CREATININE 1.08 06/28/2013 0524   CALCIUM 8.5 (L) 12/31/2017 1028   CALCIUM 8.3 (L) 06/28/2013 0524   GFRNONAA >60 12/31/2017 1028   GFRNONAA >60 06/28/2013 0524   GFRAA >60 12/31/2017 1028   GFRAA >60 06/28/2013 0524   CrCl cannot be calculated (Patient's most recent lab result is older than the maximum 21 days allowed.).  COAG No results found for: "INR", "PROTIME"  Radiology No results found.   Assessment/Plan There are no diagnoses linked to this encounter.   Levora Dredge, MD  11/09/2022 1:09 PM

## 2022-11-11 ENCOUNTER — Other Ambulatory Visit (INDEPENDENT_AMBULATORY_CARE_PROVIDER_SITE_OTHER): Payer: Self-pay | Admitting: Vascular Surgery

## 2022-11-11 DIAGNOSIS — I8310 Varicose veins of unspecified lower extremity with inflammation: Secondary | ICD-10-CM

## 2022-11-11 DIAGNOSIS — I872 Venous insufficiency (chronic) (peripheral): Secondary | ICD-10-CM

## 2022-11-12 ENCOUNTER — Encounter (INDEPENDENT_AMBULATORY_CARE_PROVIDER_SITE_OTHER): Payer: Medicare Other

## 2022-11-12 ENCOUNTER — Ambulatory Visit (INDEPENDENT_AMBULATORY_CARE_PROVIDER_SITE_OTHER): Payer: Medicare Other | Admitting: Vascular Surgery

## 2022-11-12 DIAGNOSIS — I872 Venous insufficiency (chronic) (peripheral): Secondary | ICD-10-CM

## 2022-11-12 DIAGNOSIS — I8310 Varicose veins of unspecified lower extremity with inflammation: Secondary | ICD-10-CM

## 2022-11-12 DIAGNOSIS — I10 Essential (primary) hypertension: Secondary | ICD-10-CM

## 2022-11-12 DIAGNOSIS — K219 Gastro-esophageal reflux disease without esophagitis: Secondary | ICD-10-CM

## 2023-02-12 ENCOUNTER — Ambulatory Visit
Admission: EM | Admit: 2023-02-12 | Discharge: 2023-02-12 | Disposition: A | Discharge status: Home / Self Care | Payer: Medicare HMO | Attending: Physician Assistant | Admitting: Physician Assistant

## 2023-02-12 ENCOUNTER — Encounter: Payer: Self-pay | Admitting: Emergency Medicine

## 2023-02-12 DIAGNOSIS — Z1152 Encounter for screening for COVID-19: Secondary | ICD-10-CM | POA: Diagnosis not present

## 2023-02-12 DIAGNOSIS — J069 Acute upper respiratory infection, unspecified: Secondary | ICD-10-CM | POA: Insufficient documentation

## 2023-02-12 DIAGNOSIS — R058 Other specified cough: Secondary | ICD-10-CM | POA: Diagnosis not present

## 2023-02-12 LAB — RESP PANEL BY RT-PCR (RSV, FLU A&B, COVID)  RVPGX2
Influenza A by PCR: NEGATIVE
Influenza B by PCR: NEGATIVE
Resp Syncytial Virus by PCR: NEGATIVE
SARS Coronavirus 2 by RT PCR: NEGATIVE

## 2023-02-12 MED ORDER — BENZONATATE 100 MG PO CAPS: 100.0000 mg | ORAL_CAPSULE | Freq: Three times a day (TID) | ORAL | 0 refills | Status: DC

## 2023-02-12 MED ORDER — PREDNISONE 10 MG (21) PO TBPK: ORAL_TABLET | Freq: Every day | ORAL | 0 refills | Status: DC

## 2023-02-12 NOTE — Discharge Instructions (Signed)
Your symptoms today are most likely being caused by a virus and should steadily improve in time it can take up to 7 to 10 days before you truly start to see a turnaround however things will get better  COVID, flu and RSV testing is negative  Begin supportive, this medicine helps to reduce inflammation and irritation to calm your cough and shortness of breath as well as your back pain which is most likely related to your muscles  You may use Tessalon pill every 8 hours in addition to help calm your coughing, this can be used as needed    You can take Tylenol and/or Ibuprofen as needed for fever reduction and pain relief.   For cough: honey 1/2 to 1 teaspoon (you can dilute the honey in water or another fluid).  You can also use guaifenesin and dextromethorphan for cough. You can use a humidifier for chest congestion and cough.  If you don't have a humidifier, you can sit in the bathroom with the hot shower running.      For sore throat: try warm salt water gargles, cepacol lozenges, throat spray, warm tea or water with lemon/honey, popsicles or ice, or OTC cold relief medicine for throat discomfort.   For congestion: take a daily anti-histamine like Zyrtec, Claritin, and a oral decongestant, such as pseudoephedrine.  You can also use Flonase 1-2 sprays in each nostril daily.   It is important to stay hydrated: drink plenty of fluids (water, gatorade/powerade/pedialyte, juices, or teas) to keep your throat moisturized and help further relieve irritation/discomfort.

## 2023-02-12 NOTE — ED Triage Notes (Signed)
Patient c/o cough and chest congestion that started 4 days ago.  Patient states that last night he had some SOB.  Patient unsure of fevers.

## 2023-02-12 NOTE — ED Provider Notes (Signed)
[facility]    CSN: [id number] Arrival date & time: 02/12/23  [id number]      History   Chief Complaint Chief Complaint  Patient presents with   Cough    HPI Luis Jennings is a 77 y.o. male.   Patient presents for evaluation of nasal congestion, rhinorrhea and a productive cough present for 4 days.  Began to experience shortness of breath while coughing and right-sided low back pain beginning overnight.  Currently denies presence of shortness of breath.  Has been having intermittent wheezing in addition.  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  Has attempted use of DayQuil, Tylenol and VapoRub which has been somewhat helpful.  History of asthma.  Denies fevers.   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 Authorizing Provider  albuterol (PROVENTIL HFA;VENTOLIN HFA) 108 (90 Base) MCG/ACT inhaler Inhale 2 puffs into the lungs every 6 (six) hours as needed for wheezing or shortness of breath.    [provider]  aspirin EC 81 MG tablet Take 81 mg by mouth daily.    [provider]  cetirizine (ZYRTEC) 10 MG tablet Take 10 mg by mouth daily.    [provider]  fluticasone (FLONASE) 50 MCG/ACT nasal spray Place 1 spray into both nostrils daily.    [provider]  lisinopril (PRINIVIL,ZESTRIL) 2.5 MG tablet Take 2.5 mg by mouth daily. Patient not taking: Reported on 08/10/2022    [provider]  omeprazole (PRILOSEC) 20  MG capsule Take 20 mg by mouth daily.    [provider]  tamsulosin (FLOMAX) 0.4 MG CAPS capsule Take 0.4 mg by mouth.    [provider]    Family History Family History  Problem Relation Age of Onset   Hypertension Mother    Diabetes Mother    Cancer Mother     Social History Social History   Tobacco Use   Smoking status: Former   Smokeless tobacco: Never  Scientific laboratory technician Use: Never used  Substance Use Topics   Alcohol use: No   Drug use: No     Allergies   Patient has no known allergies.   Review of Systems Review of Systems  Constitutional: Negative.   HENT:  Positive for congestion and rhinorrhea. Negative for dental problem, drooling, ear discharge, ear pain, facial swelling, hearing loss, mouth sores, nosebleeds, postnasal drip, sinus pressure, sinus pain, sneezing, sore throat, tinnitus, trouble swallowing and voice change.   Respiratory:  Positive for cough, shortness of breath and wheezing. Negative for apnea, choking, chest tightness and stridor.   Cardiovascular: Negative.   Gastrointestinal: Negative.   Skin: Negative.   Neurological: Negative.      Physical Exam Triage Vital Signs ED Triage Vitals  Enc Vitals Group     BP 02/12/23 0942 (!) 157/88     Pulse Rate 02/12/23 0942 86     Resp 02/12/23 0942 15  Temp 02/12/23 0942 98.5 F (36.9 C)     Temp Source 02/12/23 0942 Oral     SpO2 02/12/23 0942 95 %     Weight 02/12/23 0939 212 lb 4.9 oz (96.3 kg)     Height 02/12/23 0939 '5\' 10"'$  (1.778 m)     Head Circumference --      Peak Flow --      Pain Score 02/12/23 0939 0     Pain Loc --      Pain Edu? --      Excl. in [place]? --    No data found.  Updated Vital Signs BP (!) 157/88 (BP Location: Left Arm)   Pulse 86   Temp 98.5 F (36.9 C) (Oral)   Resp 15   Ht '5\' 10"'$  (1.778 m)   Wt 212 lb 4.9 oz (96.3 kg)   SpO2 95%   BMI 30.46 kg/m   Visual Acuity Right Eye Distance:   Left Eye Distance:   Bilateral  Distance:    Right Eye Near:   Left Eye Near:    Bilateral Near:     Physical Exam Constitutional:      Appearance: Normal appearance.  HENT:     Head: Normocephalic.     Right Ear: Tympanic membrane, ear canal and external ear normal.     Left Ear: Tympanic membrane, ear canal and external ear normal.     Nose: Congestion and rhinorrhea present.     Mouth/Throat:     Mouth: Mucous membranes are moist.     Pharynx: Posterior oropharyngeal erythema present.  Eyes:     Extraocular Movements: Extraocular movements intact.  Cardiovascular:     Rate and Rhythm: Normal rate and regular rhythm.     Pulses: Normal pulses.     Heart sounds: Normal heart sounds.  Pulmonary:     Effort: Pulmonary effort is normal.     Breath sounds: Normal breath sounds.  Skin:    General: Skin is warm and dry.  Neurological:     Mental Status: He is alert and oriented to person, place, and time. Mental status is at baseline.  Psychiatric:        Mood and Affect: Mood normal.        Behavior: Behavior normal.      UC Treatments / Results  Labs (all labs ordered are listed, but only abnormal results are displayed) Labs Reviewed  RESP PANEL BY RT-PCR (RSV, FLU A&B, COVID)  [id number]    EKG   Radiology No results found.  Procedures Procedures (including critical care time)  Medications Ordered in UC Medications - No data to display  Initial Impression / Assessment and Plan / UC Course  I have reviewed the triage vital signs and the nursing notes.  Pertinent labs & imaging results that were available during my care of the patient were reviewed by me and considered in my medical decision making (see chart for details).  Viral URI with cough  Patient is in no signs of distress nor toxic appearing.  Vital signs are stable.  Low suspicion for pneumonia, pneumothorax or bronchitis and therefore will defer imaging.  COVID, flu and RSV testing negative, discussed with patient.  Prescribed  prednisone and Tessalon for outpatient use.May use additional over-the-counter medications as needed for supportive care.  May follow-up with urgent care as needed if symptoms persist or worsen.   Final Clinical Impressions(s) / UC Diagnoses   Final diagnoses:  None   Discharge Instructions  None    ED Prescriptions   None    PDMP not reviewed this encounter.   Hans Eden, NP 02/12/23 1048

## 2023-02-27 ENCOUNTER — Ambulatory Visit
Admission: EM | Admit: 2023-02-27 | Discharge: 2023-02-27 | Disposition: A | Discharge status: Home / Self Care | Payer: Medicare HMO | Attending: Physician Assistant | Admitting: Physician Assistant

## 2023-02-27 ENCOUNTER — Encounter: Payer: Self-pay | Admitting: Emergency Medicine

## 2023-02-27 ENCOUNTER — Ambulatory Visit (INDEPENDENT_AMBULATORY_CARE_PROVIDER_SITE_OTHER): Payer: Medicare HMO

## 2023-02-27 DIAGNOSIS — R0600 Dyspnea, unspecified: Secondary | ICD-10-CM | POA: Diagnosis not present

## 2023-02-27 DIAGNOSIS — R051 Acute cough: Secondary | ICD-10-CM

## 2023-02-27 DIAGNOSIS — J40 Bronchitis, not specified as acute or chronic: Secondary | ICD-10-CM | POA: Diagnosis not present

## 2023-02-27 DIAGNOSIS — J45901 Unspecified asthma with (acute) exacerbation: Secondary | ICD-10-CM

## 2023-02-27 MED ORDER — AZITHROMYCIN 250 MG PO TABS: 250.0000 mg | ORAL_TABLET | Freq: Every day | ORAL | 0 refills | Status: DC

## 2023-02-27 MED ORDER — PREDNISONE 20 MG PO TABS: 40.0000 mg | ORAL_TABLET | Freq: Every day | ORAL | 0 refills | Status: AC

## 2023-02-27 MED ORDER — PROMETHAZINE-DM 6.25-15 MG/5ML PO SYRP: 5.0000 mL | ORAL_SOLUTION | Freq: Four times a day (QID) | ORAL | 0 refills | Status: AC | PRN

## 2023-02-27 NOTE — ED Provider Notes (Addendum)
[facility]    CSN: [id number] Arrival date & time: 02/27/23  1315      History   Chief Complaint Chief Complaint  Patient presents with   Cough   Shortness of Breath    HPI Luis Jennings is a 77 y.o. male with a history of asthma, GERD, and hypertension. Patient presents today for productive cough, chest congestion, intermittent fatigue and shortness of breath for the past 3 weeks.  He reports sweats that have been coming and going since before he was even sick.  He denies any associated fevers.  He says he has some chest discomfort when he coughs only.  Denies any palpitations or increased leg swelling.  Not currently experiencing any pain in his chest.  He feels a little bit of pain in his upper back at this time.  Patient was seen here on 02/12/23 and diagnosed with URI and asthma exacerbation. Treated with benzonatate and prednisone taper.  He says he felt he was getting better until the past week when his symptoms started to get worse.  Reports his cough is productive of yellowish sputum.  He has used his inhaler and says it is help with his breathing and he does not really feel too short of breath at this time.  No known exposure to flu or COVID. Negative respiratory panel at previous visit 2 weeks ago.   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 Authorizing Provider  azithromycin (ZITHROMAX) 250 MG tablet Take 1 tablet (250 mg total) by mouth daily.  Take first 2 tablets together, then 1 every day until finished. 02/27/23  Yes Danton Clap, PA-C  predniSONE (DELTASONE) 20 MG tablet Take 2 tablets (40 mg total) by mouth daily for 5 days. 02/27/23 03/04/23 Yes Danton Clap, PA-C  promethazine-dextromethorphan (PROMETHAZINE-DM) 6.25-15 MG/5ML syrup Take 5 mLs by mouth 4 (four) times daily as needed. 02/27/23  Yes Laurene Footman B, PA-C  albuterol (PROVENTIL HFA;VENTOLIN HFA) 108 (90 Base) MCG/ACT inhaler Inhale 2 puffs into the lungs every 6 (six) hours as needed for wheezing or shortness of breath.    [provider]  aspirin EC 81 MG tablet Take 81 mg by mouth daily.    [provider]  cetirizine (ZYRTEC) 10 MG tablet Take 10 mg by mouth daily.    [provider]  fluticasone (FLONASE) 50 MCG/ACT nasal spray Place 1 spray into both nostrils daily.    [provider]  lisinopril (PRINIVIL,ZESTRIL) 2.5 MG tablet Take 2.5 mg by mouth daily. Patient not taking: Reported on 08/10/2022    [provider]  omeprazole (PRILOSEC) 20 MG capsule Take 20 mg by mouth daily.    [provider]  tamsulosin (FLOMAX) 0.4 MG CAPS capsule Take 0.4 mg by mouth.    [provider]    Family History Family History  Problem Relation Age of Onset   Hypertension Mother  Diabetes Mother    Cancer Mother     Social History Social History   Tobacco Use   Smoking status: Former   Smokeless tobacco: Never  Scientific laboratory technician Use: Never used  Substance Use Topics   Alcohol use: No   Drug use: No     Allergies   Patient has no known allergies.   Review of Systems Review of Systems  Constitutional:  Positive for diaphoresis and fatigue. Negative for fever.  HENT:  Positive for congestion. Negative for rhinorrhea, sinus pressure, sinus pain and sore throat.   Respiratory:  Positive for cough, shortness of breath and wheezing.   Cardiovascular:  Positive for chest pain (with cough).   Gastrointestinal:  Negative for abdominal pain, diarrhea, nausea and vomiting.  Musculoskeletal:  Negative for myalgias.  Neurological:  Negative for weakness, light-headedness and headaches.  Hematological:  Negative for adenopathy.     Physical Exam Triage Vital Signs ED Triage Vitals  Enc Vitals Group     BP      Pulse      Resp      Temp      Temp src      SpO2      Weight      Height      Head Circumference      Peak Flow      Pain Score      Pain Loc      Pain Edu?      Excl. in [place]?    No data found.  Updated Vital Signs BP (!) 132/95 (BP Location: Right Arm)   Pulse 71   Temp 98.1 F (36.7 C) (Oral)   Resp 16   Ht 5\' 10"  (1.778 m)   Wt 212 lb 4.9 oz (96.3 kg)   SpO2 97%   BMI 30.46 kg/m    Physical Exam Vitals and nursing note reviewed.  Constitutional:      General: He is not in acute distress.    Appearance: Normal appearance. He is well-developed. He is not ill-appearing.  HENT:     Head: Normocephalic and atraumatic.     Nose: Nose normal.     Mouth/Throat:     Mouth: Mucous membranes are moist.     Pharynx: Oropharynx is clear.  Eyes:     General: No scleral icterus.    Conjunctiva/sclera: Conjunctivae normal.  Cardiovascular:     Rate and Rhythm: Normal rate and regular rhythm.     Heart sounds: Normal heart sounds.  Pulmonary:     Effort: Pulmonary effort is normal. No respiratory distress.     Breath sounds: Wheezing (few scattered wheezes which mostly clear with cough) present.  Musculoskeletal:     Cervical back: Neck supple.     Right lower leg: No edema.     Left lower leg: No edema.  Skin:    General: Skin is warm and dry.     Capillary Refill: Capillary refill takes less than 2 seconds.  Neurological:     General: No focal deficit present.     Mental Status: He is alert. Mental status is at baseline.     Motor: No weakness.     Gait: Gait normal.  Psychiatric:        Mood and Affect: Mood normal.        Behavior:  Behavior normal.      UC Treatments / Results  Labs (all labs ordered are listed, but only abnormal results  are displayed) Labs Reviewed - No data to display  EKG   Radiology DG Chest 2 View  Result Date: 02/27/2023 CLINICAL DATA:  Productive cough EXAM: CHEST - 2 VIEW COMPARISON:  None Available. FINDINGS: Normal mediastinum and cardiac silhouette. Normal pulmonary vasculature. No evidence of effusion, infiltrate, or pneumothorax. No acute bony abnormality. IMPRESSION: No acute cardiopulmonary process. Electronically Signed   By: Suzy Bouchard M.D.   On: 02/27/2023 15:22    Procedures Procedures (including critical care time)  Medications Ordered in UC Medications - No data to display  Initial Impression / Assessment and Plan / UC Course  I have reviewed the triage vital signs and the nursing notes.  Pertinent labs & imaging results that were available during my care of the patient were reviewed by me and considered in my medical decision making (see chart for details).   77 y/o male presents for 3-week history of productive cough, intermittent fatigue, intermittent sweats, shortness of breath/wheezing.  Seen here 2 weeks ago and given prednisone taper and benzonatate after having a negative respiratory panel.  Reports that he was improving until about a week ago and symptoms started to get worse.  He has not had any fevers, sinus pain, sore throat.  He does have a history of asthma.  Vitals are stable.  He is not in any acute distress at this time.  On exam normal HEENT exam.  Few scattered wheezes throughout chest which mostly clear with cough.  No peripheral edema noted.  Heart regular rate and rhythm.  CXR ordered today. Negative.  Reviewed results of x-ray with patient.  Suspect acute bronchitis and asthma exacerbation. Supportive care encouraged with increasing rest and fluids.  Will try azithromycin since he has been feeling worse lately and prednisone 40 mg daily x 5  days. Sent promethazine DM as needed for cough. Advised continuing with inhalers and nebulizers as needed for shortness of breath.  Reviewed going to the emergency department if breathing acutely worsens otherwise he is to follow-up with PCP or pulmonologist if symptoms or not improving over the next few days.  If at any point his chest pain worsens or he feels weak, dizzy, has palpitations or increased leg swelling he is to go to the ER or call EMS.   Final Clinical Impressions(s) / UC Diagnoses   Final diagnoses:  Bronchitis  Exacerbation of asthma, unspecified asthma severity, unspecified whether persistent  Acute cough  Dyspnea, unspecified type     Discharge Instructions      -Negative x-ray.  No evidence of fluid or pneumonia on x-ray. - Likely bronchitis and flareup of asthma.  I sent an antibiotic, prednisone and cough medicine to pharmacy.  Continue to use your inhalers. - If your symptoms worsen, go to ER.  If you are still not feeling better in the next week make an appoint with your PCP. - If at any point your chest pain worsens or you should feel dizzy, weak, have racing heart or increased swelling in her legs, go to ER.     ED Prescriptions     Medication Sig Dispense Auth. Provider   predniSONE (DELTASONE) 20 MG tablet Take 2 tablets (40 mg total) by mouth daily for 5 days. 10 tablet Danton Clap, PA-C   promethazine-dextromethorphan (PROMETHAZINE-DM) 6.25-15 MG/5ML syrup Take 5 mLs by mouth 4 (four) times daily as needed. 118 mL Laurene Footman B, PA-C   azithromycin (ZITHROMAX) 250 MG tablet Take 1 tablet (250 mg total) by mouth daily. Take  first 2 tablets together, then 1 every day until finished. 6 tablet Gretta Cool      PDMP not reviewed this encounter.   Danton Clap, PA-C 02/27/23 1534    Laurene Footman B, PA-C 02/27/23 1534

## 2023-02-27 NOTE — Discharge Instructions (Addendum)
-  Negative x-ray.  No evidence of fluid or pneumonia on x-ray. - Likely bronchitis and flareup of asthma.  I sent an antibiotic, prednisone and cough medicine to pharmacy.  Continue to use your inhalers. - If your symptoms worsen, go to ER.  If you are still not feeling better in the next week make an appoint with your PCP. - If at any point your chest pain worsens or you should feel dizzy, weak, have racing heart or increased swelling in her legs, go to ER.

## 2023-02-27 NOTE — ED Triage Notes (Signed)
Patient reports ongoing cough, SOB, and chills that have been going on for several weeks.  Patient  states that he has been having some night sweats.  Patient denies fevers.

## 2023-04-18 DIAGNOSIS — I890 Lymphedema, not elsewhere classified: Secondary | ICD-10-CM | POA: Insufficient documentation

## 2023-04-18 NOTE — Progress Notes (Unsigned)
MRN : [id number]  Luis Jennings is a 77 y.o. (12-01-1946) male who presents with chief complaint of legs swell.  History of Present Illness: ***  No outpatient medications have been marked as taking for the 04/19/23 encounter (Appointment) with Gilda Crease, Latina Craver, MD.    Past Medical History:  Diagnosis Date   GERD (gastroesophageal reflux disease)    Hypertension    Seasonal allergies     Past Surgical History:  Procedure Laterality Date   BACK SURGERY     PROSTATE SURGERY      Social History Social History   Tobacco Use   Smoking status: Former   Smokeless tobacco: Never  Building services engineer Use: Never used  Substance Use Topics   Alcohol use: No   Drug use: No    Family History Family History  Problem Relation Age of Onset   Hypertension Mother    Diabetes Mother    Cancer Mother     No Known Allergies   REVIEW OF SYSTEMS (Negative unless checked)  Constitutional: [] Weight loss  [] Fever  [] Chills Cardiac: [] Chest pain   [] Chest pressure   [] Palpitations   [] Shortness of breath when laying flat   [] Shortness of breath with exertion. Vascular:  [] Pain in legs with walking   [x] Pain in legs with standing  [] History of DVT   [] Phlebitis   [x] Swelling in legs   [] Varicose veins   [] Non-healing ulcers Pulmonary:   [] Uses home oxygen   [] Productive cough   [] Hemoptysis   [] Wheeze  [] COPD   [] Asthma Neurologic:  [] Dizziness   [] Seizures   [] History of stroke   [] History of TIA  [] Aphasia   [] Vissual changes   [] Weakness or numbness in arm   [] Weakness or numbness in leg Musculoskeletal:   [] Joint swelling   [] Joint pain   [] Low back pain Hematologic:  [] Easy bruising  [] Easy bleeding   [] Hypercoagulable state   [] Anemic Gastrointestinal:  [] Diarrhea   [] Vomiting  [x] Gastroesophageal reflux/heartburn   [] Difficulty swallowing. Genitourinary:  [] Chronic kidney disease   [] Difficult urination  [] Frequent urination   [] Blood in urine Skin:  [] Rashes   [] Ulcers   Psychological:  [] History of anxiety   []  History of major depression.  Physical Examination  There were no vitals filed for this visit. There is no height or weight on file to calculate BMI. Gen: WD/WN, NAD Head: [place]/AT, No temporalis wasting.  Ear/Nose/Throat: Hearing grossly intact, nares w/o erythema or drainage, pinna without lesions Eyes: PER, EOMI, sclera nonicteric.  Neck: Supple, no gross masses.  No JVD.  Pulmonary:  Good air movement, no audible wheezing, no use of accessory muscles.  Cardiac: RRR, precordium not hyperdynamic. Vascular:  scattered varicosities present bilaterally.  Mild venous stasis changes to the legs bilaterally.  3-4+ soft pitting edema, CEAP [id number]  Vessel Right Left  Radial Palpable Palpable  Gastrointestinal: soft, non-distended. No guarding/no peritoneal signs.  Musculoskeletal: M/S 5/5 throughout.  No deformity.  Neurologic: CN 2-12 intact. Pain and light touch intact in extremities.  Symmetrical.  Speech is fluent. Motor exam as listed above. Psychiatric: Judgment intact, Mood & affect appropriate for pt's clinical situation. Dermatologic: Venous rashes no ulcers noted.  No changes consistent with cellulitis. Lymph : No lichenification or skin changes of chronic lymphedema.  CBC Lab Results  Component Value Date   WBC 3.7 (L) 12/31/2017   HGB 12.9 (L) 12/31/2017   HCT 38.1 (L) 12/31/2017   MCV 95.0 12/31/2017   PLT 200 12/31/2017  BMET    Component Value Date/Time   NA 135 12/31/2017 1028   NA 139 06/28/2013 0524   K 4.0 12/31/2017 1028   K 4.6 06/28/2013 0524   CL 104 12/31/2017 1028   CL 108 (H) 06/28/2013 0524   CO2 25 12/31/2017 1028   CO2 29 06/28/2013 0524   GLUCOSE 145 (H) 12/31/2017 1028   GLUCOSE 131 (H) 06/28/2013 0524   BUN 16 12/31/2017 1028   BUN 16 06/28/2013 0524   CREATININE 0.84 12/31/2017 1028   CREATININE 1.08 06/28/2013 0524   CALCIUM 8.5 (L) 12/31/2017 1028   CALCIUM 8.3 (L) 06/28/2013 0524   GFRNONAA  >60 12/31/2017 1028   GFRNONAA >60 06/28/2013 0524   GFRAA >60 12/31/2017 1028   GFRAA >60 06/28/2013 0524   CrCl cannot be calculated (Patient's most recent lab result is older than the maximum 21 days allowed.).  COAG No results found for: "INR", "PROTIME"  Radiology No results found.   Assessment/Plan There are no diagnoses linked to this encounter.   Levora Dredge, MD  04/18/2023 12:53 PM

## 2023-04-19 ENCOUNTER — Ambulatory Visit (INDEPENDENT_AMBULATORY_CARE_PROVIDER_SITE_OTHER): Payer: Medicare HMO | Admitting: Vascular Surgery

## 2023-04-19 ENCOUNTER — Encounter (INDEPENDENT_AMBULATORY_CARE_PROVIDER_SITE_OTHER): Payer: Self-pay | Admitting: Vascular Surgery

## 2023-04-19 VITALS — BP 126/79 | HR 79 | Resp 17 | Ht 68.0 in | Wt 211.8 lb

## 2023-04-19 DIAGNOSIS — I890 Lymphedema, not elsewhere classified: Secondary | ICD-10-CM

## 2023-04-19 DIAGNOSIS — I872 Venous insufficiency (chronic) (peripheral): Secondary | ICD-10-CM | POA: Diagnosis not present

## 2023-04-19 DIAGNOSIS — I10 Essential (primary) hypertension: Secondary | ICD-10-CM

## 2023-04-19 DIAGNOSIS — K219 Gastro-esophageal reflux disease without esophagitis: Secondary | ICD-10-CM | POA: Diagnosis not present

## 2023-05-24 ENCOUNTER — Telehealth (INDEPENDENT_AMBULATORY_CARE_PROVIDER_SITE_OTHER): Payer: Self-pay

## 2023-05-24 NOTE — Telephone Encounter (Signed)
Pt called wanting to know when his next appt is.  I told him and he stated verbal understanding

## 2023-06-15 ENCOUNTER — Telehealth (INDEPENDENT_AMBULATORY_CARE_PROVIDER_SITE_OTHER): Payer: Self-pay | Admitting: Nurse Practitioner

## 2023-06-15 NOTE — Telephone Encounter (Signed)
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 Recommendation of pump. Patient recorded Measurements: Left ankle  30.0cm (5/11);  30.2cm(6/11) Right ankle  30.4cm(5/11)  30.6cm(6/1) Left calf      37.4cm(5/11);  37.6cm(6/11) Right calf         37.0cm(5/11);  37.3cm(6/11)

## 2023-06-22 ENCOUNTER — Telehealth (INDEPENDENT_AMBULATORY_CARE_PROVIDER_SITE_OTHER): Payer: Self-pay

## 2023-06-22 NOTE — Telephone Encounter (Signed)
Left message for patient to return call to the office.

## 2023-07-18 NOTE — Progress Notes (Deleted)
MRN : [id number]  Luis Jennings is a 77 y.o. (01/18/1946) male who presents with chief complaint of legs swell.  History of Present Illness:   The patient returns to the office for followup evaluation regarding leg swelling.  The swelling has persisted and the pain associated with swelling continues.  The patient notes the swelling of both lower extremities is significantly worse.  Per the patient there is also increasing discoloration and skin changes that he is concerned about.  There have not been any interval development of a ulcerations or wounds.  There have not been any interval episodes of infection.   Since the previous visit the patient has been wearing graduated compression stockings and has noted little if any improvement in the lymphedema. The patient has been using compression routinely morning until night.   The patient also states elevation during the day and exercise is being done too.   He is very concerned as he feels like his swelling (lymphedema) is worsening in spite of his efforts to control it.  No outpatient medications have been marked as taking for the 07/19/23 encounter (Appointment) with Gilda Crease, Latina Craver, MD.    Past Medical History:  Diagnosis Date   GERD (gastroesophageal reflux disease)    Hypertension    Seasonal allergies     Past Surgical History:  Procedure Laterality Date   BACK SURGERY     PROSTATE SURGERY      Social History Social History   Tobacco Use   Smoking status: Former   Smokeless tobacco: Never  Vaping Use   Vaping status: Never Used  Substance Use Topics   Alcohol use: No   Drug use: No    Family History Family History  Problem Relation Age of Onset   Hypertension Mother    Diabetes Mother    Cancer Mother     No Known Allergies   REVIEW OF SYSTEMS (Negative unless checked)  Constitutional: [] Weight loss  [] Fever  [] Chills Cardiac: [] Chest pain   [] Chest pressure   [] Palpitations    [] Shortness of breath when laying flat   [] Shortness of breath with exertion. Vascular:  [] Pain in legs with walking   [x] Pain in legs with standing  [] History of DVT   [] Phlebitis   [x] Swelling in legs   [] Varicose veins   [] Non-healing ulcers Pulmonary:   [] Uses home oxygen   [] Productive cough   [] Hemoptysis   [] Wheeze  [] COPD   [] Asthma Neurologic:  [] Dizziness   [] Seizures   [] History of stroke   [] History of TIA  [] Aphasia   [] Vissual changes   [] Weakness or numbness in arm   [] Weakness or numbness in leg Musculoskeletal:   [] Joint swelling   [] Joint pain   [] Low back pain Hematologic:  [] Easy bruising  [] Easy bleeding   [] Hypercoagulable state   [] Anemic Gastrointestinal:  [] Diarrhea   [] Vomiting  [x] Gastroesophageal reflux/heartburn   [] Difficulty swallowing. Genitourinary:  [] Chronic kidney disease   [] Difficult urination  [] Frequent urination   [] Blood in urine Skin:  [] Rashes   [] Ulcers  Psychological:  [] History of anxiety   []  History of major depression.  Physical Examination  There were no vitals filed for this visit. There is no height or weight on file to calculate BMI. Gen: WD/WN, NAD Head: [place]/AT, No temporalis wasting.  Ear/Nose/Throat: Hearing grossly intact, nares w/o erythema or  drainage, pinna without lesions Eyes: PER, EOMI, sclera nonicteric.  Neck: Supple, no gross masses.  No JVD.  Pulmonary:  Good air movement, no audible wheezing, no use of accessory muscles.  Cardiac: RRR, precordium not hyperdynamic. Vascular:  scattered varicosities present bilaterally.  Mild venous stasis changes to the legs bilaterally.  3-4+ soft pitting edema, CEAP [id number]  Vessel Right Left  Radial Palpable Palpable  Gastrointestinal: soft, non-distended. No guarding/no peritoneal signs.  Musculoskeletal: M/S 5/5 throughout.  No deformity.  Neurologic: CN 2-12 intact. Pain and light touch intact in extremities.  Symmetrical.  Speech is fluent. Motor exam as listed above. Psychiatric:  Judgment intact, Mood & affect appropriate for pt's clinical situation. Dermatologic: Venous rashes no ulcers noted.  No changes consistent with cellulitis. Lymph : No lichenification or skin changes of chronic lymphedema.  CBC Lab Results  Component Value Date   WBC 3.7 (L) 12/31/2017   HGB 12.9 (L) 12/31/2017   HCT 38.1 (L) 12/31/2017   MCV 95.0 12/31/2017   PLT 200 12/31/2017    BMET    Component Value Date/Time   NA 135 12/31/2017 1028   NA 139 06/28/2013 0524   K 4.0 12/31/2017 1028   K 4.6 06/28/2013 0524   CL 104 12/31/2017 1028   CL 108 (H) 06/28/2013 0524   CO2 25 12/31/2017 1028   CO2 29 06/28/2013 0524   GLUCOSE 145 (H) 12/31/2017 1028   GLUCOSE 131 (H) 06/28/2013 0524   BUN 16 12/31/2017 1028   BUN 16 06/28/2013 0524   CREATININE 0.84 12/31/2017 1028   CREATININE 1.08 06/28/2013 0524   CALCIUM 8.5 (L) 12/31/2017 1028   CALCIUM 8.3 (L) 06/28/2013 0524   GFRNONAA >60 12/31/2017 1028   GFRNONAA >60 06/28/2013 0524   GFRAA >60 12/31/2017 1028   GFRAA >60 06/28/2013 0524   CrCl cannot be calculated (Patient's most recent lab result is older than the maximum 21 days allowed.).  COAG No results found for: "INR", "PROTIME"  Radiology No results found.   Assessment/Plan There are no diagnoses linked to this encounter.   Levora Dredge, MD  07/18/2023 2:36 PM

## 2023-07-19 ENCOUNTER — Telehealth (INDEPENDENT_AMBULATORY_CARE_PROVIDER_SITE_OTHER): Payer: Self-pay

## 2023-07-19 ENCOUNTER — Encounter (INDEPENDENT_AMBULATORY_CARE_PROVIDER_SITE_OTHER): Payer: Self-pay

## 2023-07-19 ENCOUNTER — Ambulatory Visit (INDEPENDENT_AMBULATORY_CARE_PROVIDER_SITE_OTHER): Payer: Medicare HMO | Admitting: Vascular Surgery

## 2023-07-19 DIAGNOSIS — K219 Gastro-esophageal reflux disease without esophagitis: Secondary | ICD-10-CM

## 2023-07-19 DIAGNOSIS — I10 Essential (primary) hypertension: Secondary | ICD-10-CM

## 2023-07-19 DIAGNOSIS — I872 Venous insufficiency (chronic) (peripheral): Secondary | ICD-10-CM

## 2023-07-19 DIAGNOSIS — I890 Lymphedema, not elsewhere classified: Secondary | ICD-10-CM

## 2023-07-19 NOTE — Telephone Encounter (Signed)
Patient called to cancel his appt, need to reschedule because he has covid.   Please call patient.
